# Patient Record
Sex: Male | Born: 1955 | Race: White | Hispanic: No | Marital: Married | Smoking: Never smoker
Health system: Southern US, Community
[De-identification: ages and names within clinical notes are randomized; demographics above are authoritative.]

## PROBLEM LIST (undated history)

## (undated) DIAGNOSIS — E785 Hyperlipidemia, unspecified: Secondary | ICD-10-CM

## (undated) DIAGNOSIS — I10 Essential (primary) hypertension: Secondary | ICD-10-CM

## (undated) DIAGNOSIS — E119 Type 2 diabetes mellitus without complications: Secondary | ICD-10-CM

## (undated) DIAGNOSIS — R7989 Other specified abnormal findings of blood chemistry: Secondary | ICD-10-CM

## (undated) DIAGNOSIS — R7303 Prediabetes: Secondary | ICD-10-CM

## (undated) HISTORY — PX: VASECTOMY: SHX75

## (undated) HISTORY — PX: COLONOSCOPY: SHX174

## (undated) HISTORY — DX: Hyperlipidemia, unspecified: E78.5

## (undated) HISTORY — DX: Prediabetes: R73.03

## (undated) HISTORY — PX: MENISCUS REPAIR: SHX5179

## (undated) HISTORY — DX: Type 2 diabetes mellitus without complications: E11.9

---

## 2000-06-24 HISTORY — PX: ROTATOR CUFF REPAIR: SHX139

## 2000-12-18 ENCOUNTER — Ambulatory Visit (HOSPITAL_COMMUNITY): Admission: RE | Admit: 2000-12-18 | Discharge: 2000-12-18 | Payer: Self-pay | Admitting: Orthopedic Surgery

## 2000-12-18 ENCOUNTER — Encounter: Payer: Self-pay | Admitting: Orthopedic Surgery

## 2001-02-11 ENCOUNTER — Ambulatory Visit (HOSPITAL_COMMUNITY): Admission: RE | Admit: 2001-02-11 | Discharge: 2001-02-11 | Payer: Self-pay | Admitting: Orthopedic Surgery

## 2007-03-03 ENCOUNTER — Ambulatory Visit: Payer: Self-pay | Admitting: Gastroenterology

## 2007-03-04 ENCOUNTER — Ambulatory Visit: Payer: Self-pay | Admitting: Gastroenterology

## 2007-09-15 DIAGNOSIS — K648 Other hemorrhoids: Secondary | ICD-10-CM | POA: Insufficient documentation

## 2007-09-15 DIAGNOSIS — I10 Essential (primary) hypertension: Secondary | ICD-10-CM | POA: Insufficient documentation

## 2007-09-15 DIAGNOSIS — E291 Testicular hypofunction: Secondary | ICD-10-CM | POA: Insufficient documentation

## 2007-09-15 DIAGNOSIS — E1159 Type 2 diabetes mellitus with other circulatory complications: Secondary | ICD-10-CM

## 2007-09-15 DIAGNOSIS — K5732 Diverticulitis of large intestine without perforation or abscess without bleeding: Secondary | ICD-10-CM | POA: Insufficient documentation

## 2007-09-15 DIAGNOSIS — K644 Residual hemorrhoidal skin tags: Secondary | ICD-10-CM | POA: Insufficient documentation

## 2010-11-06 NOTE — Assessment & Plan Note (Signed)
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 Alessandra Bevels for screening colonoscopy exam.   Carlos [name] had abnormal bowel movements all of his life and needs a  high fiber diet.  He will occasionally have some bright red blood per  rectum and associated tenesmus.  He has been told in the past he had  external hemorrhoids, but he has never had colonoscopy or barium enema  exams.  He denies abdominal pain, gas, bloating.  He currently is no  dietary restrictions per Dr. Alessandra Bevels and has been able to lose 15 pounds  of weight.  He denies any upper GI or hepatobiliary complaints.  He  denies any specific food intolerances.   PAST MEDICAL HISTORY:  Is otherwise remarkable for:  1. Mild essential hypertension.  2. Hypogonadism.   He has never had surgical procedures except for rotator cuff surgery  some 4 years ago.   MEDICATIONS:  1. Micardis 20 mg a day.  2. Testosterone 1 mL a day.  3. B12 2000 mcg a day.  4. D3 10,000 units a day.  5. Iodine 7.5 mg a day.  6. Omega 3, 3000 mg a day.  7. Arimidex 1 mg once a week.   IN THE PAST HE HAS HAD REACTIONS TO SULFA MEDICATIONS.   FAMILY HISTORY:  Noncontributory, without any known colon polyps or  cancer.   SOCIAL HISTORY:  1. He is married and lives with his wife.  2. He has a high school education.  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  4. He does not smoke or use ethanol.   REVIEW OF SYSTEMS:  Otherwise noncontributory except for history of mild  hyperlipidemia.   He is a healthy appearing white male in no distress, appearing his  stated.  He is 6 feet tall and weighs 289 pounds.  Blood pressure is  112/76 and pulse was 68 and regular.  I could not  appreciate stigmata of  chronic liver disease or thyromegaly.  CHEST:  Clear and he appeared to be a regular rhythm without murmurs,  gallops or rubs.  I could not appreciate hepatosplenomegaly, abdominal masses or  tenderness.  Bowel sounds were normal.  Peripheral extremities were  unremarkable.  MENTAL STATUS:  Clear.  INSPECTION OF RECTUM:  Did show some nonbleeding, noninflamed external  hemorrhoidal tissue.  Could not see any fissures or fistulae.  Rectal  exam was deferred.   ASSESSMENT:  1. Intermittent rectal bleeding probably from mixed hemorrhoids.  2. Need for screening colonoscopy because of his age.  3. Well controlled essential hypertension and hyperlipidemia.  4. Mild obesity, on dietary therapy per Dr. Jacqulyn Bath.  5. History of B12 deficiency.  6. Treated hypogonadism.  7. Status post rotator cuff surgery.   RECOMMENDATIONS:  I have scheduled Carlos [name] for outpatient colonoscopy  at  his convenience.  The risks and benefits of the procedure and  alternative methods of examining the colon have been explained to the  patient in detail and he has agreed to proceed as planned.  He is to  continue all of his multiple medications per the excellent care of Dr.  Alessandra Bevels.     Vania Rea. Jarold Motto, MD, Caleen Essex, FAGA  Electronically Signed    DRP/MedQ  DD: 03/03/2007  DT: 03/03/2007  Job #: [id number]   cc:   Allena Napoleon

## 2010-11-09 NOTE — Op Note (Signed)
[facility]  Patient:    Carlos [name], Carlos [name] Visit Number: [id number] MRN: [id number]          Service Type: DSU Location: DAY Attending Physician:  Marlowe Kays Page Proc. Date: 02/11/01 Adm. Date:  02/11/2001                             Operative Report  PREOPERATIVE DIAGNOSES: 1. Labral disruption (superior labrum anterior and posterior lesion). 2. 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  POSTOPERATIVE DIAGNOSES: 1. Labral disruption (superior labrum anterior and posterior lesion). 2. 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  OPERATIONs 1. Right shoulder arthroscopy with shaving of glenoid labrum. 2. Arthroscopic subacromial decompression.  SURGEON:  Illene Labrador. [place], M.D.  ASSISTANT:  Harrie Foreman, P.A.-C.  ANESTHESIA:  General.  PATHOLOGY AND JUSTIFICATION FOR PROCEDURE:  Injured his right shoulder wrestling several years ago, has chronic pain.  He has an arthritic AC joint but no tenderness there.  He has tenderness over the rotator cuff.  MRI has shown some impingement of the underneath surface of the [facility] joint on the rotator cuff as well as rotator cuff tendinopathy from type 2 acromion. Because of the disability of his shoulder, he is here today for treatment. See operative description below for findings.  DESCRIPTION OF PROCEDURE:  Satisfactory general anesthesia, beach chair position on the Schlein frame.  Right shoulder girdle was prepped with DuraPrep and draped in a sterile field.  The anatomy of the shoulder was marked out.  Posterior soft spot, lateral portal, and subacromion areas were infiltrated with 0.5% Marcaine with adrenalin.  Through a posterior soft spot portal, I introduced a blunt trocar into the glenohumeral joint without difficulty.  On evaluation of the joint, the humeral head had minimal wear. The biceps tendon looked normal,  and the underneath surface of the rotator cuff looked normal.  He had significant fraying of most of the labrum, but this did not appear to be detached.  Because of the labral disruption, I advanced the scope anteriorly between the biceps and subscapularis, used the switching stick, and made an anterior incision and over this, placed a metal cannula which I introduced in the joint followed by the 4.2 shaver. I then shaved down the glenoid labrum until smooth, confirming that the labrum was not detached.  Final pictures were taken.  I then advanced the scope and subacromial area and through the lateral portal introduced the 4.2 shaver and then performed a subdeltoid bursectomy.  He had a good bit of soft tissue on the underneath surface of the acromion which was contributing significantly to impingement.  He also had some disruption of the bursal surface of the rotator cuff as depicted on the MRI.  This was pictured and shaved down with the 4.2 shaver.  I then also used the Arthrocare vaporizer to remove most of the soft tissue on the underneath surface of the acromion, going back medial to the [facility] joint and around the anterior leading edge of the acromion.  I then introduced the 4.0 bur and began burring down the underneath surface of the acromion and distal clavicle and AC joint and then alternated back and forth between the bur, and Arthrocare vaporizer, and the 4.2 shaver until I felt like we had thorough decompression.  Thee was no unusual bleeding at the conclusion of the case.  Representative pictures were taken with  the arm down to the side and with the arm abducted showing excellent clearance in the subacromial area. All fluid was then evacuated from the subacromial joint, and the three portals and subacromial area were all infiltrated once again with 0.5% Marcaine with adrenalin.  The portals were closed with 4-0 nylon, Betadine, and Adaptic dry sterile dressing, and a large shoulder  immobilizer were applied.  At the time of this dictation, he was on his way to the recovery room in satisfactory condition with no known complications. Attending Physician:  Joaquin Courts DD:  02/11/01 TD:  02/11/01 Job: [id number]

## 2015-03-28 ENCOUNTER — Ambulatory Visit (INDEPENDENT_AMBULATORY_CARE_PROVIDER_SITE_OTHER): Payer: BLUE CROSS/BLUE SHIELD | Admitting: Family Medicine

## 2015-03-28 ENCOUNTER — Encounter: Payer: Self-pay | Admitting: Family Medicine

## 2015-03-28 VITALS — BP 140/86 | HR 77 | Wt 292.0 lb

## 2015-03-28 DIAGNOSIS — M940 Chondrocostal junction syndrome [Tietze]: Secondary | ICD-10-CM

## 2015-03-28 DIAGNOSIS — M999 Biomechanical lesion, unspecified: Secondary | ICD-10-CM

## 2015-03-28 DIAGNOSIS — M9902 Segmental and somatic dysfunction of thoracic region: Secondary | ICD-10-CM | POA: Diagnosis not present

## 2015-03-28 DIAGNOSIS — M9908 Segmental and somatic dysfunction of rib cage: Secondary | ICD-10-CM

## 2015-03-28 DIAGNOSIS — M9901 Segmental and somatic dysfunction of cervical region: Secondary | ICD-10-CM | POA: Diagnosis not present

## 2015-03-28 MED ORDER — DICLOFENAC SODIUM 2 % TD SOLN: 2.0000 "application " | Freq: Two times a day (BID) | TRANSDERMAL | Status: DC

## 2015-03-28 NOTE — Patient Instructions (Signed)
Good to see  You it has been a long time! Work on posture and try to do these exercises 3 times a week When at a computer keep monitor at eye level and keep back of shoulder blades in chair Stand on wall with heels, butt shoulder and head touching for a goal of 5 minutes daily Vitamin D 5000 IU daily Turmeric 500mg  twice daily pennsaid pinkie amount topically 2 times daily as needed.  See me again in 4-6 weeks if you need me.

## 2015-03-28 NOTE — Assessment & Plan Note (Signed)
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 We discussed icing regimen, home exercises, and patient work with athletic trainer to learn scapular stabilization techniques. We discussed icing regimen and patient given trial of topical anti-inflammatory's as well as prescription.we also discussed over-the-counter natural supplementations. Encourage weight loss. Patient will return again in 3-4 weeks for further evaluation and treatment.

## 2015-03-28 NOTE — Progress Notes (Signed)
Pre visit review using our clinic review tool, if applicable. No additional management support is needed unless otherwise documented below in the visit note. 

## 2015-03-28 NOTE — Progress Notes (Signed)
[name] [address], [place] [address] Phone: [phone] Subjective:    I'm seeing this patient by the request  of:  Vaughn M.D.  CC: back pain  [id number]:[id number] Carlos [name] is a 59 y.o. male coming in with complaint of back pain. Patient states that he is been doing relatively well and then was doing a lot of repetitive casting while he was fishing and felt a muscle spasm on the right side. Patient describes it as more of a chronic compression force near the shoulder blade. Patient has had some problems since then. Does respond to some over-the-counter anti-inflammatory's. No severe radiation down the arm or up towards the neck. Denies any fever, chills, any abnormal weight loss. Can make a difficulty at comfortable at night. Has responded well to manipulation in the past and is wondering if that would be a possible treatment option.  No past medical history on file. No past surgical history on file. Social History  Substance Use Topics  . Smoking status: Never Smoker   . Smokeless tobacco: None  . Alcohol Use: None   Allergies  Allergen Reactions  . Sulfa Antibiotics        No family history on file.   Past medical history, social, surgical and family history all reviewed in electronic medical record.   Review of Systems: No headache, visual changes, nausea, vomiting, diarrhea, constipation, dizziness, abdominal pain, skin rash, fevers, chills, night sweats, weight loss, swollen lymph nodes, body aches, joint swelling, muscle aches, chest pain, shortness of breath, mood changes.   Objective Blood pressure 140/86, pulse 77, weight 292 lb (132.45 kg), SpO2 95 %.  General: No apparent distress alert and oriented x3 mood and affect normal, dressed appropriately. overweight HEENT: Pupils equal, extraocular movements intact  Respiratory: Patient's speak in full sentences and does not appear short of breath  Cardiovascular: No lower  extremity edema, non tender, no erythema  Skin: Warm dry intact with no signs of infection or rash on extremities or on axial skeleton.  Abdomen: Soft nontender  Neuro: Cranial nerves II through XII are intact, neurovascularly intact in all extremities with 2+ DTRs and 2+ pulses.  Lymph: No lymphadenopathy of posterior or anterior cervical chain or axillae bilaterally.  Gait normal with good balance and coordination.  MSK:  Non tender with full range of motion and good stability and symmetric strength and tone of shoulders, elbows, wrist, hip, knee and ankles bilaterally.   Back Exam:  Inspection: Unremarkable  Motion: Flexion 45 deg, Extension 45 deg, Side Bending to 45 deg bilaterally,  Rotation to 45 deg bilaterally  SLR laying: Negative  XSLR laying: Negative  Palpable tenderness: None. FABER: negative. Sensory change: Gross sensation intact to all lumbar and sacral dermatomes.  Reflexes: 2+ at both patellar tendons, 2+ at achilles tendons, Babinski's downgoing.  Strength at foot  Plantar-flexion: 5/5 Dorsi-flexion: 5/5 Eversion: 5/5 Inversion: 5/5  Leg strength  Quad: 5/5 Hamstring: 5/5 Hip flexor: 5/5 Hip abductors: 5/5  Gait unremarkable. Mild scapular dyskinesia noted on the right side  Osteopathic findings C4 flexed rotated and side bent right C7 flexed rotated and side bent left  T1 extended rotated and side bent right with elevated first rib T5 extended rotated and side bent right on the right side with inhaled fifth rib  L2 flexed rotated and side bent left   Impression and Recommendations:     This case required medical decision making of moderate complexity.

## 2015-03-28 NOTE — Assessment & Plan Note (Signed)
Decision today to treat with OMT was based on Physical Exam  After verbal consent patient was treated with HVLA, ME, FPR techniques in cervical, thoracic and rib areas  Patient tolerated the procedure well with improvement in symptoms  Patient given exercises, stretches and lifestyle modifications  See medications in patient instructions if given  Patient will follow up in 3-4 weeks      

## 2015-10-16 DIAGNOSIS — B029 Zoster without complications: Secondary | ICD-10-CM | POA: Diagnosis not present

## 2015-10-16 DIAGNOSIS — E7219 Other disorders of sulfur-bearing amino-acid metabolism: Secondary | ICD-10-CM | POA: Diagnosis not present

## 2015-10-16 DIAGNOSIS — E291 Testicular hypofunction: Secondary | ICD-10-CM | POA: Diagnosis not present

## 2015-10-16 DIAGNOSIS — E039 Hypothyroidism, unspecified: Secondary | ICD-10-CM | POA: Diagnosis not present

## 2015-10-16 LAB — CBC AND DIFFERENTIAL
HCT: 50 (ref 41–53)
Hemoglobin: 16.9 (ref 13.5–17.5)
PLATELETS: 197 (ref 150–399)
WBC: 4.6

## 2015-10-16 LAB — HEPATIC FUNCTION PANEL
ALT: 14 (ref 10–40)
AST: 29 (ref 14–40)
Alkaline Phosphatase: 90 (ref 25–125)
Bilirubin, Total: 1.3

## 2015-10-16 LAB — PSA: PSA: 3.52

## 2015-10-16 LAB — BASIC METABOLIC PANEL
BUN: 13 (ref 4–21)
CREATININE: 1.1 (ref <=1.3)
Glucose: 91
POTASSIUM: 4.7 (ref 3.4–5.3)
SODIUM: 140 (ref 137–147)

## 2015-10-16 LAB — HEMOGLOBIN A1C: Hemoglobin A1C: 5.5

## 2015-10-30 DIAGNOSIS — E291 Testicular hypofunction: Secondary | ICD-10-CM | POA: Diagnosis not present

## 2015-11-06 DIAGNOSIS — S82142A Displaced bicondylar fracture of left tibia, initial encounter for closed fracture: Secondary | ICD-10-CM | POA: Diagnosis not present

## 2015-11-06 DIAGNOSIS — M25562 Pain in left knee: Secondary | ICD-10-CM | POA: Diagnosis not present

## 2015-11-08 ENCOUNTER — Encounter (HOSPITAL_COMMUNITY): Payer: Self-pay | Admitting: *Deleted

## 2015-11-08 DIAGNOSIS — S82142D Displaced bicondylar fracture of left tibia, subsequent encounter for closed fracture with routine healing: Secondary | ICD-10-CM | POA: Diagnosis not present

## 2015-11-08 MED ORDER — DEXTROSE 5 % IV SOLN
3.0000 g | INTRAVENOUS | Status: AC
  Administered 2015-11-09: 3 g via INTRAVENOUS
  Filled 2015-11-08: qty 3000

## 2015-11-08 MED ORDER — CHLORHEXIDINE GLUCONATE 4 % EX LIQD: 60.0000 mL | Freq: Once | CUTANEOUS | Status: DC

## 2015-11-08 NOTE — Progress Notes (Signed)
Pt denies cardiac history, chest pain or sob. Pt is on Metformin but states he takes it "preventatively". States Dr. Adriana Simas, his PCP told him he no longer had diabetes.

## 2015-11-09 ENCOUNTER — Encounter (HOSPITAL_COMMUNITY)
Admission: RE | Disposition: A | Discharge status: Home / Self Care | Payer: Self-pay | Source: Ambulatory Visit | Attending: Orthopedic Surgery

## 2015-11-09 ENCOUNTER — Ambulatory Visit (HOSPITAL_COMMUNITY): Payer: BLUE CROSS/BLUE SHIELD | Admitting: Anesthesiology

## 2015-11-09 ENCOUNTER — Encounter (HOSPITAL_COMMUNITY): Payer: Self-pay | Admitting: *Deleted

## 2015-11-09 ENCOUNTER — Ambulatory Visit (HOSPITAL_COMMUNITY): Payer: BLUE CROSS/BLUE SHIELD

## 2015-11-09 ENCOUNTER — Ambulatory Visit (HOSPITAL_COMMUNITY)
Admission: RE | Admit: 2015-11-09 | Discharge: 2015-11-09 | Disposition: A | Discharge status: Home / Self Care | Payer: BLUE CROSS/BLUE SHIELD | Source: Ambulatory Visit | Attending: Orthopedic Surgery | Admitting: Orthopedic Surgery

## 2015-11-09 DIAGNOSIS — Z791 Long term (current) use of non-steroidal anti-inflammatories (NSAID): Secondary | ICD-10-CM | POA: Diagnosis not present

## 2015-11-09 DIAGNOSIS — E119 Type 2 diabetes mellitus without complications: Secondary | ICD-10-CM | POA: Diagnosis not present

## 2015-11-09 DIAGNOSIS — I10 Essential (primary) hypertension: Secondary | ICD-10-CM | POA: Insufficient documentation

## 2015-11-09 DIAGNOSIS — S82142A Displaced bicondylar fracture of left tibia, initial encounter for closed fracture: Secondary | ICD-10-CM | POA: Diagnosis not present

## 2015-11-09 DIAGNOSIS — X58XXXA Exposure to other specified factors, initial encounter: Secondary | ICD-10-CM | POA: Insufficient documentation

## 2015-11-09 DIAGNOSIS — Z7984 Long term (current) use of oral hypoglycemic drugs: Secondary | ICD-10-CM | POA: Diagnosis not present

## 2015-11-09 DIAGNOSIS — Z6836 Body mass index (BMI) 36.0-36.9, adult: Secondary | ICD-10-CM | POA: Diagnosis not present

## 2015-11-09 DIAGNOSIS — Z79899 Other long term (current) drug therapy: Secondary | ICD-10-CM | POA: Insufficient documentation

## 2015-11-09 DIAGNOSIS — T148XXA Other injury of unspecified body region, initial encounter: Secondary | ICD-10-CM

## 2015-11-09 DIAGNOSIS — G8918 Other acute postprocedural pain: Secondary | ICD-10-CM | POA: Diagnosis not present

## 2015-11-09 DIAGNOSIS — S82122D Displaced fracture of lateral condyle of left tibia, subsequent encounter for closed fracture with routine healing: Secondary | ICD-10-CM | POA: Diagnosis not present

## 2015-11-09 HISTORY — DX: Other specified abnormal findings of blood chemistry: R79.89

## 2015-11-09 HISTORY — PX: ORIF TIBIA PLATEAU: SHX2132

## 2015-11-09 HISTORY — DX: Essential (primary) hypertension: I10

## 2015-11-09 LAB — BASIC METABOLIC PANEL
ANION GAP: 11 (ref 5–15)
BUN: 16 mg/dL (ref 6–20)
CALCIUM: 9.5 mg/dL (ref 8.9–10.3)
CHLORIDE: 100 mmol/L — AB (ref 101–111)
CO2: 27 mmol/L (ref 22–32)
Creatinine, Ser: 1.15 mg/dL (ref 0.61–1.24)
GFR calc Af Amer: >60 mL/min (ref >60)
GFR calc non Af Amer: >60 mL/min (ref >60)
Glucose, Bld: 110 mg/dL — ABNORMAL HIGH (ref 65–99)
POTASSIUM: 4.7 mmol/L (ref 3.5–5.1)
SODIUM: 138 mmol/L (ref 135–145)

## 2015-11-09 LAB — GLUCOSE, CAPILLARY: Glucose-Capillary: 106 mg/dL — ABNORMAL HIGH (ref 65–99)

## 2015-11-09 LAB — CBC
HCT: 47.9 % (ref 39.0–52.0)
HEMOGLOBIN: 16.1 g/dL (ref 13.0–17.0)
MCH: 29.9 pg (ref 26.0–34.0)
MCHC: 33.6 g/dL (ref 30.0–36.0)
MCV: 88.9 fL (ref 78.0–100.0)
PLATELETS: 199 10*3/uL (ref 150–400)
RBC: 5.39 MIL/uL (ref 4.22–5.81)
RDW: 13.3 % (ref 11.5–15.5)
WBC: 7.2 10*3/uL (ref 4.0–10.5)

## 2015-11-09 SURGERY — OPEN REDUCTION INTERNAL FIXATION (ORIF) TIBIAL PLATEAU
Anesthesia: Regional | Site: Knee | Laterality: Left

## 2015-11-09 MED ORDER — FENTANYL CITRATE (PF) 100 MCG/2ML IJ SOLN
INTRAMUSCULAR | Status: AC
  Administered 2015-11-09: 100 ug
  Filled 2015-11-09: qty 2

## 2015-11-09 MED ORDER — PROPOFOL 10 MG/ML IV BOLUS
INTRAVENOUS | Status: DC | PRN
  Administered 2015-11-09: 200 mg via INTRAVENOUS

## 2015-11-09 MED ORDER — MIDAZOLAM HCL 5 MG/ML IJ SOLN
2.0000 mg | Freq: Once | INTRAMUSCULAR | Status: AC
  Administered 2015-11-09: 2 mg via INTRAVENOUS

## 2015-11-09 MED ORDER — ROCURONIUM BROMIDE 50 MG/5ML IV SOLN
INTRAVENOUS | Status: AC
  Filled 2015-11-09: qty 2

## 2015-11-09 MED ORDER — ACETAMINOPHEN 325 MG PO TABS
325.0000 mg | ORAL_TABLET | ORAL | Status: DC | PRN
  Administered 2015-11-09: 650 mg via ORAL

## 2015-11-09 MED ORDER — ACETAMINOPHEN 160 MG/5ML PO SOLN
325.0000 mg | ORAL | Status: DC | PRN
  Filled 2015-11-09: qty 20.3

## 2015-11-09 MED ORDER — MIDAZOLAM HCL 2 MG/2ML IJ SOLN
INTRAMUSCULAR | Status: AC
  Filled 2015-11-09: qty 2

## 2015-11-09 MED ORDER — LIDOCAINE 2% (20 MG/ML) 5 ML SYRINGE
INTRAMUSCULAR | Status: AC
  Filled 2015-11-09: qty 5

## 2015-11-09 MED ORDER — ONDANSETRON HCL 4 MG PO TABS: 4.0000 mg | ORAL_TABLET | Freq: Three times a day (TID) | ORAL | Status: DC | PRN

## 2015-11-09 MED ORDER — DIAZEPAM 5 MG PO TABS: 2.5000 mg | ORAL_TABLET | Freq: Four times a day (QID) | ORAL | Status: DC | PRN

## 2015-11-09 MED ORDER — LACTATED RINGERS IV SOLN
INTRAVENOUS | Status: DC
  Administered 2015-11-09 (×3): via INTRAVENOUS

## 2015-11-09 MED ORDER — 0.9 % SODIUM CHLORIDE (POUR BTL) OPTIME
TOPICAL | Status: DC | PRN
  Administered 2015-11-09: 1000 mL

## 2015-11-09 MED ORDER — FENTANYL CITRATE (PF) 100 MCG/2ML IJ SOLN: 100.0000 ug | Freq: Once | INTRAMUSCULAR | Status: DC

## 2015-11-09 MED ORDER — PROPOFOL 10 MG/ML IV BOLUS
INTRAVENOUS | Status: AC
  Filled 2015-11-09: qty 40

## 2015-11-09 MED ORDER — HYDROMORPHONE HCL 1 MG/ML IJ SOLN: 0.2500 mg | INTRAMUSCULAR | Status: DC | PRN

## 2015-11-09 MED ORDER — BUPIVACAINE-EPINEPHRINE (PF) 0.5% -1:200000 IJ SOLN
INTRAMUSCULAR | Status: DC | PRN
  Administered 2015-11-09: 30 mL via PERINEURAL
  Administered 2015-11-09: 25 mL via PERINEURAL

## 2015-11-09 MED ORDER — ONDANSETRON HCL 4 MG/2ML IJ SOLN
INTRAMUSCULAR | Status: AC
  Filled 2015-11-09: qty 2

## 2015-11-09 MED ORDER — OXYCODONE HCL 5 MG PO TABS: 5.0000 mg | ORAL_TABLET | Freq: Once | ORAL | Status: DC | PRN

## 2015-11-09 MED ORDER — OXYCODONE-ACETAMINOPHEN 5-325 MG PO TABS: 1.0000 | ORAL_TABLET | ORAL | Status: DC | PRN

## 2015-11-09 MED ORDER — ONDANSETRON HCL 4 MG/2ML IJ SOLN
INTRAMUSCULAR | Status: DC | PRN
  Administered 2015-11-09: 4 mg via INTRAVENOUS

## 2015-11-09 MED ORDER — OXYCODONE HCL 5 MG/5ML PO SOLN: 5.0000 mg | Freq: Once | ORAL | Status: DC | PRN

## 2015-11-09 MED ORDER — FENTANYL CITRATE (PF) 250 MCG/5ML IJ SOLN
INTRAMUSCULAR | Status: AC
  Filled 2015-11-09: qty 5

## 2015-11-09 MED ORDER — ACETAMINOPHEN 325 MG PO TABS
ORAL_TABLET | ORAL | Status: AC
  Filled 2015-11-09: qty 2

## 2015-11-09 MED ORDER — LIDOCAINE HCL (CARDIAC) 20 MG/ML IV SOLN
INTRAVENOUS | Status: DC | PRN
  Administered 2015-11-09: 60 mg via INTRAVENOUS

## 2015-11-09 SURGICAL SUPPLY — 81 items
BANDAGE ELASTIC 4 VELCRO ST LF (GAUZE/BANDAGES/DRESSINGS) IMPLANT
BANDAGE ELASTIC 6 VELCRO ST LF (GAUZE/BANDAGES/DRESSINGS) ×2 IMPLANT
BANDAGE ESMARK 6X9 LF (GAUZE/BANDAGES/DRESSINGS) ×1 IMPLANT
BLADE SURG 10 STRL SS (BLADE) ×2 IMPLANT
BLADE SURG 15 STRL LF DISP TIS (BLADE) ×1 IMPLANT
BLADE SURG 15 STRL SS (BLADE) ×1
BLADE SURG ROTATE 9660 (MISCELLANEOUS) IMPLANT
BNDG COHESIVE 6X5 TAN STRL LF (GAUZE/BANDAGES/DRESSINGS) ×2 IMPLANT
BNDG ESMARK 6X9 LF (GAUZE/BANDAGES/DRESSINGS) ×2
BNDG GAUZE ELAST 4 BULKY (GAUZE/BANDAGES/DRESSINGS) ×2 IMPLANT
CLEANER TIP ELECTROSURG 2X2 (MISCELLANEOUS) ×2 IMPLANT
COVER MAYO STAND STRL (DRAPES) ×2 IMPLANT
CUFF TOURNIQUET SINGLE 34IN LL (TOURNIQUET CUFF) ×2 IMPLANT
CUFF TOURNIQUET SINGLE 44IN (TOURNIQUET CUFF) IMPLANT
DERMABOND ADHESIVE PROPEN (GAUZE/BANDAGES/DRESSINGS) ×1
DERMABOND ADVANCED .7 DNX6 (GAUZE/BANDAGES/DRESSINGS) ×1 IMPLANT
DRAPE C-ARM 42X72 X-RAY (DRAPES) ×2 IMPLANT
DRAPE C-ARMOR (DRAPES) ×2 IMPLANT
DRAPE INCISE IOBAN 66X45 STRL (DRAPES) ×4 IMPLANT
DRAPE U-SHAPE 47X51 STRL (DRAPES) ×2 IMPLANT
DRSG ADAPTIC 3X8 NADH LF (GAUZE/BANDAGES/DRESSINGS) ×2 IMPLANT
DRSG AQUACEL AG ADV 3.5X 4 (GAUZE/BANDAGES/DRESSINGS) ×2 IMPLANT
DRSG PAD ABDOMINAL 8X10 ST (GAUZE/BANDAGES/DRESSINGS) ×4 IMPLANT
DURAPREP 26ML APPLICATOR (WOUND CARE) ×2 IMPLANT
ELECT REM PT RETURN 9FT ADLT (ELECTROSURGICAL) ×2
ELECTRODE REM PT RTRN 9FT ADLT (ELECTROSURGICAL) ×1 IMPLANT
GAUZE SPONGE 4X4 12PLY STRL (GAUZE/BANDAGES/DRESSINGS) ×4 IMPLANT
GLOVE BIO SURGEON STRL SZ7.5 (GLOVE) ×2 IMPLANT
GLOVE BIO SURGEON STRL SZ8 (GLOVE) ×4 IMPLANT
GLOVE BIOGEL M STER SZ 6 (GLOVE) ×2 IMPLANT
GLOVE BIOGEL PI IND STRL 6 (GLOVE) ×1 IMPLANT
GLOVE BIOGEL PI IND STRL 6.5 (GLOVE) ×1 IMPLANT
GLOVE BIOGEL PI IND STRL 7.0 (GLOVE) ×1 IMPLANT
GLOVE BIOGEL PI INDICATOR 6 (GLOVE) ×1
GLOVE BIOGEL PI INDICATOR 6.5 (GLOVE) ×1
GLOVE BIOGEL PI INDICATOR 7.0 (GLOVE) ×1
GLOVE EUDERMIC 7 POWDERFREE (GLOVE) ×2 IMPLANT
GLOVE SS BIOGEL STRL SZ 7.5 (GLOVE) ×1 IMPLANT
GLOVE SUPERSENSE BIOGEL SZ 7.5 (GLOVE) ×1
GLOVE SURG SS PI 6.0 STRL IVOR (GLOVE) ×2 IMPLANT
GLOVE SURG SS PI 6.5 STRL IVOR (GLOVE) ×2 IMPLANT
GOWN STRL REUS W/ TWL LRG LVL3 (GOWN DISPOSABLE) ×1 IMPLANT
GOWN STRL REUS W/ TWL XL LVL3 (GOWN DISPOSABLE) ×2 IMPLANT
GOWN STRL REUS W/TWL LRG LVL3 (GOWN DISPOSABLE) ×1
GOWN STRL REUS W/TWL XL LVL3 (GOWN DISPOSABLE) ×2
GUIDEWIRE THREADED 2.8 (WIRE) ×6 IMPLANT
KIT BASIN OR (CUSTOM PROCEDURE TRAY) ×2 IMPLANT
KIT ROOM TURNOVER OR (KITS) ×2 IMPLANT
MANIFOLD NEPTUNE II (INSTRUMENTS) ×2 IMPLANT
NEEDLE 22X1 1/2 (OR ONLY) (NEEDLE) ×2 IMPLANT
NS IRRIG 1000ML POUR BTL (IV SOLUTION) ×2 IMPLANT
PACK ORTHO EXTREMITY (CUSTOM PROCEDURE TRAY) ×2 IMPLANT
PAD ARMBOARD 7.5X6 YLW CONV (MISCELLANEOUS) ×4 IMPLANT
PAD CAST 4YDX4 CTTN HI CHSV (CAST SUPPLIES) IMPLANT
PADDING CAST COTTON 4X4 STRL (CAST SUPPLIES)
PADDING CAST COTTON 6X4 STRL (CAST SUPPLIES) IMPLANT
SCREW CANN 32 THRD/60 7.3 (Screw) ×2 IMPLANT
SCREW CANN 32 THRD/75 7.3 (Screw) ×2 IMPLANT
SCREW CANN 32 THRD/80 7.3 (Screw) ×2 IMPLANT
SPONGE LAP 18X18 X RAY DECT (DISPOSABLE) ×2 IMPLANT
STAPLER VISISTAT 35W (STAPLE) IMPLANT
STOCKINETTE IMPERVIOUS LG (DRAPES) ×2 IMPLANT
STRIP CLOSURE SKIN 1/2X4 (GAUZE/BANDAGES/DRESSINGS) ×4 IMPLANT
SUCTION FRAZIER HANDLE 10FR (MISCELLANEOUS) ×1
SUCTION TUBE FRAZIER 10FR DISP (MISCELLANEOUS) ×1 IMPLANT
SUT ETHIBOND NAB CT1 #1 30IN (SUTURE) ×2 IMPLANT
SUT MNCRL AB 3-0 PS2 18 (SUTURE) ×4 IMPLANT
SUT MON AB 2-0 CT1 36 (SUTURE) ×2 IMPLANT
SUT VIC AB 0 CT1 27 (SUTURE) ×1
SUT VIC AB 0 CT1 27XBRD ANBCTR (SUTURE) ×1 IMPLANT
SUT VIC AB 1 CT1 27 (SUTURE) ×2
SUT VIC AB 1 CT1 27XBRD ANBCTR (SUTURE) ×2 IMPLANT
SUT VIC AB 2-0 CT1 27 (SUTURE) ×1
SUT VIC AB 2-0 CT1 TAPERPNT 27 (SUTURE) ×1 IMPLANT
SYR 20ML ECCENTRIC (SYRINGE) IMPLANT
TOWEL OR 17X24 6PK STRL BLUE (TOWEL DISPOSABLE) ×2 IMPLANT
TOWEL OR 17X26 10 PK STRL BLUE (TOWEL DISPOSABLE) ×2 IMPLANT
TUBE CONNECTING 12X1/4 (SUCTIONS) ×2 IMPLANT
WASHER FOR 5.0 SCREWS (Washer) ×6 IMPLANT
WATER STERILE IRR 1000ML POUR (IV SOLUTION) IMPLANT
YANKAUER SUCT BULB TIP NO VENT (SUCTIONS) ×2 IMPLANT

## 2015-11-09 NOTE — Transfer of Care (Signed)
Immediate Anesthesia Transfer of Care Note  Patient: Carlos [name]  Procedure(s) Performed: Procedure(s): OPEN REDUCTION INTERNAL (ORIF) FIXATION LEFT LATERAL TIBIAL PLATEAU (Left)  Patient Location: PACU  Anesthesia Type:General  Level of Consciousness: awake, alert , oriented and patient cooperative  Airway & Oxygen Therapy: Patient Spontanous Breathing and Patient connected to nasal cannula oxygen  Post-op Assessment: Report given to RN and Post -op Vital signs reviewed and stable  Post vital signs: Reviewed and stable  Last Vitals:  Filed Vitals:   11/09/15 0623  BP: 132/84  Pulse: 73  Temp: 36.9 C  Resp: 20    Last Pain: There were no vitals filed for this visit.    Patients Stated Pain Goal: 4 ([id number])  Complications: No apparent anesthesia complications

## 2015-11-09 NOTE — Anesthesia Procedure Notes (Addendum)
Anesthesia Regional Block:  Popliteal block  Pre-Anesthetic Checklist: ,, timeout performed, Correct Patient, Correct Site, Correct Laterality, Correct Procedure, Correct Position, site marked, Risks and benefits discussed,  Surgical consent,  Pre-op evaluation,  At surgeon's request and post-op pain management  Laterality: Lower and Left  Prep: chloraprep       Needles:  Injection technique: Single-shot  Needle Type: Echogenic Stimulator Needle          Additional Needles:  Procedures: ultrasound guided (picture in chart) and nerve stimulator Popliteal block  Nerve Stimulator or Paresthesia:  Response: plantar, 0.5 mA,   Additional Responses:   Narrative:  Injection made incrementally with aspirations every 5 mL.  Performed by: Personally  Anesthesiologist: MOSER, CHRISTOPHER  Additional Notes: H+P and labs reviewed, risks and benefits discussed with patient, procedure tolerated well without complications   Anesthesia Regional Block:  Femoral nerve block  Pre-Anesthetic Checklist: ,, timeout performed, Correct Patient, Correct Site, Correct Laterality, Correct Procedure, Correct Position, site marked, Risks and benefits discussed,  Surgical consent,  Pre-op evaluation,  At surgeon's request and post-op pain management  Laterality: Lower and Left  Prep: chloraprep       Needles:  Injection technique: Single-shot  Needle Type: Echogenic Stimulator Needle          Additional Needles:  Procedures: ultrasound guided (picture in chart) and nerve stimulator Femoral nerve block  Nerve Stimulator or Paresthesia:  Response: quad, 0.5 mA,   Additional Responses:   Narrative:  Injection made incrementally with aspirations every 5 mL.  Performed by: Personally  Anesthesiologist: Oleta Mouse  Additional Notes: H+P and labs reviewed, risks and benefits discussed with patient, procedure tolerated well without complications   Procedure Name: LMA  Insertion Date/Time: 11/09/2015 9:01 AM Performed by: Williemae Area B Pre-anesthesia Checklist: Emergency Drugs available, Patient identified, Suction available and Patient being monitored Patient Re-evaluated:Patient Re-evaluated prior to inductionOxygen Delivery Method: Circle system utilized Preoxygenation: Pre-oxygenation with 100% oxygen Intubation Type: IV induction Ventilation: Mask ventilation with difficulty LMA: LMA inserted LMA Size: 5.0 Number of attempts: 1 Placement Confirmation: positive ETCO2 and breath sounds checked- equal and bilateral Tube secured with: Tape (taped across cheeks; gauze roll b/t teeth) Dental Injury: Teeth and Oropharynx as per pre-operative assessment  Comments: Ineffective mask ventilation due to beard

## 2015-11-09 NOTE — Discharge Instructions (Signed)
Use crutches for ambulation and touch down weight only on leg. It is ok to bend your knee slightly to dress and for hygiene but knee immobilizer when up and about Use ice for 20 minutes at a time several times a day Perform calf pumps a few times an hour and take one aspirin a day You may shower, your dressings are waterproof. Leave the large dressing on until you see [place] in office but you can change the bandaid on the inside as needed

## 2015-11-09 NOTE — Op Note (Signed)
11/09/2015  10:18 AM  PATIENT:   Carlos [name]  60 y.o. male  PRE-OPERATIVE DIAGNOSIS:  LEFT KNEE LATERAL TIBIA PLATEAU FRACTURE  POST-OPERATIVE DIAGNOSIS:  same  PROCEDURE:  ORIF left lateral tibial plateau fracture  SURGEON:  Jhayla Podgorski, Metta Clines M.D.  ASSISTANTS: Shuford pac   ANESTHESIA:   GET + FNB  EBL: min  SPECIMEN:  none  Drains: none  TT <60 min   PATIENT DISPOSITION:  PACU - hemodynamically stable.    PLAN OF CARE: Discharge to home after PACU  Dictation# [id number]   Contact # [phone]

## 2015-11-09 NOTE — H&P (Signed)
Carlos [name]    Chief Complaint: LEFT KNEE LATERAL TIBIA PLATEAU FRACTURE HPI: The patient is a 61 y.o. male with displaced left lateral tibial plateau fracture  Past Medical History  Diagnosis Date  . Hypertension   . Low testosterone     Past Surgical History  Procedure Laterality Date  . Rotator cuff repair Right 2002  . Vasectomy    . Colonoscopy      Family History  Problem Relation Age of Onset  . Cancer Mother   . Lung cancer Father   . Bladder Cancer Sister     Social History:  reports that he has never smoked. He has never used smokeless tobacco. He reports that he drinks alcohol. He reports that he does not use illicit drugs.   Medications Prior to Admission  Medication Sig Dispense Refill  . anastrozole (ARIMIDEX) 1 MG tablet Take 0.5 mg by mouth daily.    Marland Kitchen ibuprofen (ADVIL,MOTRIN) 200 MG tablet Take 400 mg by mouth every 6 (six) hours as needed (For pain.).    Marland Kitchen losartan (COZAAR) 100 MG tablet Take 100 mg by mouth daily.   0  . metFORMIN (GLUCOPHAGE-XR) 500 MG 24 hr tablet Take 500 mg by mouth daily with breakfast.   0  . niacin (NIASPAN) 500 MG CR tablet Take 500 mg by mouth 3 (three) times daily.   0  . testosterone cypionate (DEPOTESTOSTERONE CYPIONATE) 200 MG/ML injection Inject 100 mg into the muscle every 28 (twenty-eight) days. Take on Saturdays       Physical Exam: left knee with diffuse swelling and lateral tenderness as noted at recent office visit  Vitals  Temp:  [98.5 F (36.9 C)] 98.5 F (36.9 C) (05/18 0623) Pulse Rate:  [73] 73 (05/18 0623) Resp:  [20] 20 (05/18 0623) BP: (132)/(84) 132/84 mmHg (05/18 0623) SpO2:  [97 %] 97 % (05/18 0623) Weight:  [123.378 kg (272 lb)] 123.378 kg (272 lb) (05/18 [id number])  Assessment/Plan  Impression: LEFT KNEE LATERAL TIBIA PLATEAU FRACTURE  Plan of Action: Procedure(s): OPEN REDUCTION INTERNAL (ORIF) FIXATION LEFT LATERAL TIBIAL PLATEAU  Tashiya Souders M Maximilien Hayashi 11/09/2015, 8:46 AM Contact #  [phone]

## 2015-11-09 NOTE — Anesthesia Preprocedure Evaluation (Addendum)
Anesthesia Evaluation  Patient identified by MRN, date of birth, ID band Patient awake    Reviewed: Allergy & Precautions, NPO status , Patient's Chart, lab work & pertinent test results, reviewed documented beta blocker date and time   History of Anesthesia Complications Negative for: history of anesthetic complications  Airway Mallampati: II  TM Distance: >3 FB Neck ROM: Full    Dental  (+) Teeth Intact, Dental Advisory Given   Pulmonary neg pulmonary ROS,    breath sounds clear to auscultation       Cardiovascular hypertension, Pt. on medications (-) angina(-) Past MI and (-) CHF  Rhythm:Regular     Neuro/Psych negative neurological ROS  negative psych ROS   GI/Hepatic negative GI ROS, Neg liver ROS,   Endo/Other  diabetes, Well Controlled, Type 2, Oral Hypoglycemic AgentsMorbid obesity  Renal/GU negative Renal ROS     Musculoskeletal Left tibial plateau fracture   Abdominal   Peds  Hematology negative hematology ROS (+)   Anesthesia Other Findings Beard  Reproductive/Obstetrics                            Anesthesia Physical Anesthesia Plan  ASA: II  Anesthesia Plan: General and Regional   Post-op Pain Management:  Regional for Post-op pain   Induction: Intravenous  Airway Management Planned: LMA  Additional Equipment: None  Intra-op Plan:   Post-operative Plan: Extubation in OR  Informed Consent: I have reviewed the patients History and Physical, chart, labs and discussed the procedure including the risks, benefits and alternatives for the proposed anesthesia with the patient or authorized representative who has indicated his/her understanding and acceptance.   Dental advisory given  Plan Discussed with: CRNA and Surgeon  Anesthesia Plan Comments:         Anesthesia Quick Evaluation

## 2015-11-10 ENCOUNTER — Encounter (HOSPITAL_COMMUNITY): Payer: Self-pay | Admitting: Orthopedic Surgery

## 2015-11-10 NOTE — Op Note (Signed)
NAMESHAYA, MACHON                 ACCOUNT NO.:  [id number]  MEDICAL RECORD NO.:  [id number]  LOCATION:  [facility]                         FACILITY:  [facility]  PHYSICIAN:  Metta Clines. Ronny Korff, M.D.  DATE OF BIRTH:  1955-08-24  DATE OF PROCEDURE:  11/09/2015 DATE OF DISCHARGE:  11/09/2015                              OPERATIVE REPORT   PREOPERATIVE DIAGNOSIS:  Displaced left knee and lateral tibial plateau fracture.  POSTOPERATIVE DIAGNOSIS:  Displaced left knee and lateral tibial plateau fracture.  PROCEDURE:  Open reduction and internal fixation of left lateral tibial plateau fracture.  SURGEON:  Metta Clines. Jerrad Mendibles, M.D.  Terrence DupontOlivia Mackie A. Shuford, P.A.-C.  ANESTHESIA:  General endotracheal as well as a femoral nerve block.  TOURNIQUET TIME:  Less than 60 minutes.  ESTIMATED BLOOD LOSS:  Minimal.  DRAINS:  None.  HISTORY:  Mr. Gens is a 59 year old gentleman who sustained a left knee lateral tibial plateau fracture with moderate displacement.  MRI scan confirmed the degree of displacement.  He was brought to the operating room at this time for planned open reduction and internal fixation.  Preoperatively, I counseled Mr. Tornow on treatment options as well as risks versus benefits thereof.  Possible surgical complications were reviewed including bleeding, infection, neurovascular injury, DVT, PE, malunion, nonunion, loss of fixation and possible need for additional surgery.  He understands and accepts and agrees with our planned procedure.  PROCEDURE IN DETAIL:  After undergoing routine preop evaluation, the patient received prophylactic antibiotics and a femoral nerve block was established in the holding area by the Anesthesia Department.  Placed supine on the operating table, underwent smooth induction of a general endotracheal anesthesia.  Left lower extremity was sterilely prepped and draped in standard fashion with a tourniquet applied to the thigh.  Time- out was  called.  Leg was exsanguinated and the tourniquet was inflated to 350 mmHg.  I made a lateral hockey-stick incision at the longitudinal limb just lateral to the anterior tibial crest just lateral to the patellar tendon and then a posterior limb along the apex of the lateral tibial plateau posteriorly for approximately 3 cm.  Skin flap was elevated laterally, dissection carried deeply.  The fascia was then divided along the lateral margin of the tibial crest and then a posterior limb divided along the lateral tibial plateau for 3-4 cm. This allowed [place] to divide the anterior aspect of the IT band and gained access to the sub-meniscal aspect of the lateral tibial plateau and subperiosteal elevation was then used to identify the fracture site along the anterolateral aspect of the proximal tibia.  I introduced a Freer into the fracture site and this was then carefully opened and allowed [place] to irrigate and remove all interposed debris from the fracture site.  I then used the [employer], which applied through a stab wound medially and then through our wound laterally to allow [place] to compress the fracture site obtaining excellent reapproximation.  At this point, I placed three guidepins for the 6.5 cannulated screws off the Synthes tray at the appropriate positions beginning laterally.  These were then measured and placed a series of three 6.5 cannulated screws with  washers over the guidewires obtaining excellent purchase into the proximal tibial bone, allowed excellent reapposition and fixation with stable fixation of the tibial plateau fracture.  Final fluoroscopic images were obtained, which showed excellent overall position and alignment.  Good position of the hardware.  The wound was then copiously irrigated.  We then repaired the deep fascial layers as well as the anterior aspect of the IT band with #1 Vicryl sutures.  2-0 Vicryl was used to close the subcu layer, intracuticular 3-0  Monocryl for the skin followed by Steri-Strips.  Dry dressing was wrapped out the knee.  The leg was wrapped with Ace bandage, thigh support stocking, placed a knee immobilizer.  Tourniquet was let down.  Exact tourniquet time was available from the anesthesia record.  The patient was then awakened, extubated, and taken to the recovery room in stable condition.  Jenetta Loges, PA-C was used as an Environmental consultant throughout this case, essential for help with positioning of the patient, positioning of the extremity, management of the tissue retractors, wound closure and intraoperative decision making.     Metta Clines. Neddie Steedman, M.D.     KMS/MEDQ  D:  11/09/2015  T:  11/10/2015  Job:  [id number]

## 2015-11-11 NOTE — Anesthesia Postprocedure Evaluation (Signed)
Anesthesia Post Note  Patient: Carlos [name]  Procedure(s) Performed: Procedure(s) (LRB): OPEN REDUCTION INTERNAL (ORIF) FIXATION LEFT LATERAL TIBIAL PLATEAU (Left)  Patient location during evaluation: PACU Anesthesia Type: General Level of consciousness: awake Pain management: pain level controlled Vital Signs Assessment: post-procedure vital signs reviewed and stable Respiratory status: spontaneous breathing Cardiovascular status: stable Postop Assessment: no signs of nausea or vomiting Anesthetic complications: no    Last Vitals:  Filed Vitals:   11/09/15 1205 11/09/15 1217  BP: 139/78 136/83  Pulse: 67 71  Temp:  36.7 C  Resp: 17 16    Last Pain:  Filed Vitals:   11/11/15 1428  PainSc: 4                  Nitish Roes

## 2015-11-23 DIAGNOSIS — S82142D Displaced bicondylar fracture of left tibia, subsequent encounter for closed fracture with routine healing: Secondary | ICD-10-CM | POA: Diagnosis not present

## 2016-01-05 DIAGNOSIS — Z4889 Encounter for other specified surgical aftercare: Secondary | ICD-10-CM | POA: Diagnosis not present

## 2016-01-31 DIAGNOSIS — S82122D Displaced fracture of lateral condyle of left tibia, subsequent encounter for closed fracture with routine healing: Secondary | ICD-10-CM | POA: Diagnosis not present

## 2016-03-18 DIAGNOSIS — M25561 Pain in right knee: Secondary | ICD-10-CM | POA: Diagnosis not present

## 2016-03-27 DIAGNOSIS — M25561 Pain in right knee: Secondary | ICD-10-CM | POA: Diagnosis not present

## 2016-04-03 DIAGNOSIS — S83281D Other tear of lateral meniscus, current injury, right knee, subsequent encounter: Secondary | ICD-10-CM | POA: Diagnosis not present

## 2016-04-03 DIAGNOSIS — M25561 Pain in right knee: Secondary | ICD-10-CM | POA: Diagnosis not present

## 2016-04-16 DIAGNOSIS — S83281A Other tear of lateral meniscus, current injury, right knee, initial encounter: Secondary | ICD-10-CM | POA: Diagnosis not present

## 2016-04-16 DIAGNOSIS — G8918 Other acute postprocedural pain: Secondary | ICD-10-CM | POA: Diagnosis not present

## 2016-04-16 DIAGNOSIS — M94261 Chondromalacia, right knee: Secondary | ICD-10-CM | POA: Diagnosis not present

## 2016-04-16 DIAGNOSIS — M23261 Derangement of other lateral meniscus due to old tear or injury, right knee: Secondary | ICD-10-CM | POA: Diagnosis not present

## 2016-04-16 DIAGNOSIS — M659 Synovitis and tenosynovitis, unspecified: Secondary | ICD-10-CM | POA: Diagnosis not present

## 2016-04-18 DIAGNOSIS — E785 Hyperlipidemia, unspecified: Secondary | ICD-10-CM | POA: Diagnosis not present

## 2016-04-18 DIAGNOSIS — E7219 Other disorders of sulfur-bearing amino-acid metabolism: Secondary | ICD-10-CM | POA: Diagnosis not present

## 2016-04-18 DIAGNOSIS — I10 Essential (primary) hypertension: Secondary | ICD-10-CM | POA: Diagnosis not present

## 2016-04-18 DIAGNOSIS — E039 Hypothyroidism, unspecified: Secondary | ICD-10-CM | POA: Diagnosis not present

## 2016-04-18 DIAGNOSIS — R5383 Other fatigue: Secondary | ICD-10-CM | POA: Diagnosis not present

## 2016-04-18 DIAGNOSIS — E291 Testicular hypofunction: Secondary | ICD-10-CM | POA: Diagnosis not present

## 2016-04-18 DIAGNOSIS — E509 Vitamin A deficiency, unspecified: Secondary | ICD-10-CM | POA: Diagnosis not present

## 2016-04-18 LAB — TESTOSTERONE
FREE TESTOSTERONE: 146.4
Testosterone, total: 623

## 2016-04-18 LAB — BASIC METABOLIC PANEL
BUN: 17 (ref 4–21)
CREATININE: 1.3 (ref <=1.3)
Glucose: 88
Potassium: 4.5 (ref 3.4–5.3)
Sodium: 139 (ref 137–147)

## 2016-04-18 LAB — HEPATIC FUNCTION PANEL
ALK PHOS: 81 (ref 25–125)
ALT: 17 (ref 10–40)
AST: 25 (ref 14–40)
BILIRUBIN, TOTAL: 0.7

## 2016-04-18 LAB — T4, FREE: Free T4: 1.4

## 2016-04-18 LAB — LIPID PANEL
CHOLESTEROL: 238 — AB (ref 0–200)
HDL: 31 — AB (ref 35–70)
LDL CALC: 185
Triglycerides: 97 (ref 40–160)

## 2016-04-18 LAB — CBC AND DIFFERENTIAL
HCT: 48 (ref 41–53)
Hemoglobin: 16.3 (ref 13.5–17.5)
Neutrophils Absolute: 4711
PLATELETS: 205 (ref 150–399)
WBC: 7.8

## 2016-04-18 LAB — VITAMIN D 25 HYDROXY (VIT D DEFICIENCY, FRACTURES): VIT D 25 HYDROXY: 55

## 2016-04-18 LAB — CALCIUM: CALCIUM: 9.1

## 2016-04-18 LAB — ESTIMATED GFR: EGFR (Non-African Amer.): 58

## 2016-04-18 LAB — T3, FREE: FREE T3: 3.1

## 2016-04-18 LAB — FERRITIN: FERRITIN: 66

## 2016-04-18 LAB — ESTRADIOL: Estradiol: 34

## 2016-04-18 LAB — HOMOCYSTEINE: HOMOCYSTEINE: 16

## 2016-04-18 LAB — VITAMIN B12: VITAMIN B 12: 1092

## 2016-04-18 LAB — VITAMIN A: Vitamin A: 65

## 2016-04-18 LAB — PROTEIN, TOTAL: Protein: 6.4

## 2016-04-18 LAB — TSH: TSH: 0.7 (ref <=5.90)

## 2016-05-02 DIAGNOSIS — E7210 Disorders of sulfur-bearing amino-acid metabolism, unspecified: Secondary | ICD-10-CM | POA: Diagnosis not present

## 2016-05-02 DIAGNOSIS — R799 Abnormal finding of blood chemistry, unspecified: Secondary | ICD-10-CM | POA: Diagnosis not present

## 2016-05-02 DIAGNOSIS — E291 Testicular hypofunction: Secondary | ICD-10-CM | POA: Diagnosis not present

## 2016-05-02 DIAGNOSIS — N400 Enlarged prostate without lower urinary tract symptoms: Secondary | ICD-10-CM | POA: Diagnosis not present

## 2016-05-02 DIAGNOSIS — E039 Hypothyroidism, unspecified: Secondary | ICD-10-CM | POA: Diagnosis not present

## 2016-05-02 LAB — BASIC METABOLIC PANEL
BUN: 17 (ref 4–21)
CREATININE: 1.3 (ref <=1.3)
Glucose: 86
Potassium: 4.6 (ref 3.4–5.3)
Sodium: 138 (ref 137–147)

## 2016-05-02 LAB — HEPATIC FUNCTION PANEL
ALK PHOS: 97 (ref 25–125)
ALT: 14 (ref 10–40)
AST: 26 (ref 14–40)
BILIRUBIN, TOTAL: 0.7

## 2016-05-02 LAB — FECAL OCCULT BLOOD, GUAIAC: FECAL OCCULT BLD: NEGATIVE

## 2016-05-02 LAB — PSA: PSA: 3.2

## 2016-05-02 LAB — ESTIMATED GFR: GFR CALC NON AF AMER: 57

## 2016-08-21 DIAGNOSIS — H52223 Regular astigmatism, bilateral: Secondary | ICD-10-CM | POA: Diagnosis not present

## 2016-08-21 DIAGNOSIS — H353131 Nonexudative age-related macular degeneration, bilateral, early dry stage: Secondary | ICD-10-CM | POA: Diagnosis not present

## 2016-08-21 DIAGNOSIS — H524 Presbyopia: Secondary | ICD-10-CM | POA: Diagnosis not present

## 2016-08-21 DIAGNOSIS — H5203 Hypermetropia, bilateral: Secondary | ICD-10-CM | POA: Diagnosis not present

## 2016-08-21 DIAGNOSIS — H5213 Myopia, bilateral: Secondary | ICD-10-CM | POA: Diagnosis not present

## 2016-08-21 LAB — HM DIABETES EYE EXAM

## 2017-03-05 ENCOUNTER — Ambulatory Visit (INDEPENDENT_AMBULATORY_CARE_PROVIDER_SITE_OTHER): Payer: BLUE CROSS/BLUE SHIELD | Admitting: Adult Health

## 2017-03-05 ENCOUNTER — Encounter: Payer: Self-pay | Admitting: Adult Health

## 2017-03-05 VITALS — BP 130/86 | HR 73 | Ht 71.0 in | Wt 283.9 lb

## 2017-03-05 DIAGNOSIS — Z1211 Encounter for screening for malignant neoplasm of colon: Secondary | ICD-10-CM | POA: Diagnosis not present

## 2017-03-05 DIAGNOSIS — I10 Essential (primary) hypertension: Secondary | ICD-10-CM

## 2017-03-05 DIAGNOSIS — E1159 Type 2 diabetes mellitus with other circulatory complications: Secondary | ICD-10-CM

## 2017-03-05 DIAGNOSIS — E291 Testicular hypofunction: Secondary | ICD-10-CM

## 2017-03-05 DIAGNOSIS — Z0000 Encounter for general adult medical examination without abnormal findings: Secondary | ICD-10-CM

## 2017-03-05 DIAGNOSIS — I152 Hypertension secondary to endocrine disorders: Secondary | ICD-10-CM

## 2017-03-05 MED ORDER — METFORMIN HCL ER 500 MG PO TB24: 500.0000 mg | ORAL_TABLET | Freq: Two times a day (BID) | ORAL | 2 refills | Status: DC

## 2017-03-05 MED ORDER — LOSARTAN POTASSIUM 100 MG PO TABS: 100.0000 mg | ORAL_TABLET | Freq: Every day | ORAL | 2 refills | Status: DC

## 2017-03-05 NOTE — Progress Notes (Signed)
Subjective:    Patient ID: Carlos [name], male    DOB: 03/15/1956, 61 y.o.   MRN: [id number]  HPI :  [name] is here to establish as a new pt.  He is a pleasant 61 year old male.  PMH:  HTN, HL, Obesity, impaired glucose tolerance, and hypogonadism.  He follows a heart healthy diet, denies tobacco use and consumes EOTH socially.  He walks at least a mile/day and swims 1/2 hr most days of the week.  He also enjoys bass fishing on weekends. He had 2 surgical procedures on his knees 2017 and was released from [facility] last fall.   Patient Care Team    Relationship Specialty Notifications Start End  Esaw Grandchild, NP PCP - General Family Medicine  03/05/17     Patient Active Problem List   Diagnosis Date Noted  . 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 Nonallopathic lesion-rib cage 03/28/2015  . Nonallopathic lesion of cervical region 03/28/2015  . Nonallopathic lesion of thoracic region 03/28/2015  . Hypogonadism in male 09/15/2007  . Hypertension associated with diabetes ([place]) 09/15/2007  . HEMORRHOIDS, INTERNAL 09/15/2007  . EXTERNAL HEMORRHOIDS 09/15/2007  . DIVERTICULITIS OF COLON 09/15/2007     Past Medical History:  Diagnosis Date  . Hyperlipidemia   . Hypertension   . Low testosterone      Past Surgical History:  Procedure Laterality Date  . COLONOSCOPY    . MENISCUS REPAIR    . ORIF TIBIA PLATEAU Left 11/09/2015   Procedure: OPEN REDUCTION INTERNAL (ORIF) FIXATION LEFT LATERAL TIBIAL PLATEAU;  Surgeon: Justice Britain, MD;  Location: [facility];  Service: Orthopedics;  Laterality: Left;  . ROTATOR CUFF REPAIR Right 2002  . VASECTOMY       Family History  Problem Relation Age of Onset  . Cancer Mother        "male"  . Lung cancer Father   . Bladder Cancer Sister      History  Drug Use No     History  Alcohol Use  . 0.0 oz/week    Comment: occasional/social     History  Smoking Status  . Never Smoker  Smokeless Tobacco  . Never Used      Outpatient Encounter Prescriptions as of 03/05/2017  Medication Sig Note  . Acetylcysteine (NAC 600) 600 MG CAPS Take 600 mg by mouth at bedtime.   Marland Kitchen anastrozole (ARIMIDEX) 1 MG tablet Take 0.5 mg by mouth as directed. 0.5 tablet orally 1 day after testosterone injection   . co-enzyme Q-10 30 MG capsule Take 30 mg by mouth. Tablet 4 caps 5 days a week   . Cyanocobalamin (B-12) 5000 MCG CAPS Take 1 capsule by mouth Nightly.   Marland Kitchen ibuprofen (ADVIL,MOTRIN) 200 MG tablet Take 400 mg by mouth every 6 (six) hours as needed (For pain.).   Marland Kitchen losartan (COZAAR) 100 MG tablet Take 1 tablet (100 mg total) by mouth daily.   . metFORMIN (GLUCOPHAGE-XR) 500 MG 24 hr tablet Take 1 tablet (500 mg total) by mouth 2 (two) times daily.   . NON FORMULARY Take 1,200 mg by mouth Nightly.   . NON FORMULARY Take 50 mg by mouth 2 (two) times daily.   Marland Kitchen PANTETHINE PO Take 4 capsules by mouth 2 (two) times daily.   . Testosterone Cypionate 200 MG/ML KIT Inject 0.5 mLs into the muscle every 7 (seven) days.   . [DISCONTINUED] losartan (COZAAR) 100 MG tablet Take 100 mg by mouth daily.    . [  DISCONTINUED] metFORMIN (GLUCOPHAGE-XR) 500 MG 24 hr tablet Take 500 mg by mouth 2 (two) times daily.   . [DISCONTINUED] anastrozole (ARIMIDEX) 1 MG tablet Take 0.5 mg by mouth daily.   . [DISCONTINUED] diazepam (VALIUM) 5 MG tablet Take 0.5-1 tablets (2.5-5 mg total) by mouth every 6 (six) hours as needed for muscle spasms or sedation.   . [DISCONTINUED] metFORMIN (GLUCOPHAGE-XR) 500 MG 24 hr tablet Take 500 mg by mouth daily with breakfast.    . [DISCONTINUED] niacin (NIASPAN) 500 MG CR tablet Take 500 mg by mouth 3 (three) times daily.    . [DISCONTINUED] ondansetron (ZOFRAN) 4 MG tablet Take 1 tablet (4 mg total) by mouth every 8 (eight) hours as needed for nausea or vomiting.   . [DISCONTINUED] oxyCODONE-acetaminophen (PERCOCET) 5-325 MG tablet Take 1-2 tablets by mouth every 4 (four) hours as needed.   . [DISCONTINUED]  testosterone cypionate (DEPOTESTOSTERONE CYPIONATE) 200 MG/ML injection Inject 100 mg into the muscle every 28 (twenty-eight) days. Take on Saturdays 11/08/2015: Patient is unsure of the strength or how much he takes and we were unable to verify with his pharmacy because call would not go through.   No facility-administered encounter medications on file as of 03/05/2017.     Allergies: Sulfa antibiotics  Body mass index is 39.6 kg/m.  Blood pressure 130/86, pulse 73, height 5' 11" (1.803 m), weight 283 lb 14.4 oz (128.8 kg).    Review of Systems  Constitutional: Positive for fatigue. Negative for activity change, appetite change, chills, diaphoresis, fever and unexpected weight change.  HENT: Negative for congestion.   Eyes: Negative for visual disturbance.  Respiratory: Negative for cough, chest tightness, shortness of breath, wheezing and stridor.   Cardiovascular: Negative for chest pain, palpitations and leg swelling.  Gastrointestinal: Negative for abdominal distention, abdominal pain, blood in stool, constipation, diarrhea, nausea and vomiting.  Endocrine: Negative for cold intolerance, heat intolerance, polydipsia, polyphagia and polyuria.  Genitourinary: Negative for difficulty urinating, flank pain and hematuria.  Musculoskeletal: Positive for back pain and myalgias. Negative for arthralgias, gait problem, joint swelling, neck pain and neck stiffness.  Skin: Negative for color change, pallor, rash and wound.  Neurological: Negative for dizziness, tremors, weakness and headaches.  Hematological: Does not bruise/bleed easily.  Psychiatric/Behavioral: Negative for confusion, hallucinations, self-injury, sleep disturbance and suicidal ideas. The patient is not nervous/anxious and is not hyperactive.        Objective:   Physical Exam  Constitutional: He is oriented to person, place, and time. He appears well-developed and well-nourished. No distress.  HENT:  Head: Normocephalic  and atraumatic.  Right Ear: External ear normal.  Left Ear: External ear normal.  Eyes: Pupils are equal, round, and reactive to light. Conjunctivae are normal.  Cardiovascular: Normal rate, regular rhythm, normal heart sounds and intact distal pulses.   No murmur heard. Pulmonary/Chest: Effort normal and breath sounds normal. No respiratory distress. He has no wheezes. He has no rales. He exhibits no tenderness.  Neurological: He is alert and oriented to person, place, and time. Coordination normal.  Skin: Skin is warm and dry. No rash noted. He is not diaphoretic. No erythema. No pallor.  Psychiatric: He has a normal mood and affect. His behavior is normal. Judgment and thought content normal.  Nursing note and vitals reviewed.         Assessment & Plan:   1. Essential hypertension   2. Hypogonadism in male   3. Healthcare maintenance   4. Screening for colon cancer     5. Hypertension associated with diabetes ([place])     Hypertension associated with diabetes ([place]) BP at goal 130/86 Continue Losartan 141m daily and avoid Na+ in diet.   Hypogonadism in male Testosterone level obtained today. He reports being on injectable Tes.Cypionate 2012mml weekly for >8 years. We will continue therapy if appropriate.  Healthcare maintenance Full set of fasting labs + PSA and Testosterone levels drawn today. Increase water intake, strive for at least gallon/day. Heart healthy diet and continue regular movement-walking,swimming, bass fishing. CPE this fall.    FOLLOW-UP:  Return in about 3 months (around 06/04/2017) for CPE.

## 2017-03-05 NOTE — Patient Instructions (Signed)
Heart-Healthy Eating Plan Many factors influence your heart health, including eating and exercise habits. Heart (coronary) risk increases with abnormal blood fat (lipid) levels. Heart-healthy meal planning includes limiting unhealthy fats, increasing healthy fats, and making other small dietary changes. This includes maintaining a healthy body weight to help keep lipid levels within a normal range. What is my plan? Your health care provider recommends that you:  Get no more than __25__% of the total calories in your daily diet from fat.  Limit your intake of saturated fat to less than __5___% of your total calories each day.  Limit the amount of cholesterol in your diet to less than __300___ mg per day.  What types of fat should I choose?  Choose healthy fats more often. Choose monounsaturated and polyunsaturated fats, such as olive oil and canola oil, flaxseeds, walnuts, almonds, and seeds.  Eat more omega-3 fats. Good choices include salmon, mackerel, sardines, tuna, flaxseed oil, and ground flaxseeds. Aim to eat fish at least two times each week.  Limit saturated fats. Saturated fats are primarily found in animal products, such as meats, butter, and cream. Plant sources of saturated fats include palm oil, palm kernel oil, and coconut oil.  Avoid foods with partially hydrogenated oils in them. These contain trans fats. Examples of foods that contain trans fats are stick margarine, some tub margarines, cookies, crackers, and other baked goods. What general guidelines do I need to follow?  Check food labels carefully to identify foods with trans fats or high amounts of saturated fat.  Fill one half of your plate with vegetables and green salads. Eat 4-5 servings of vegetables per day. A serving of vegetables equals 1 cup of raw leafy vegetables,  cup of raw or cooked cut-up vegetables, or  cup of vegetable juice.  Fill one fourth of your plate with whole grains. Look for the word "whole"  as the first word in the ingredient list.  Fill one fourth of your plate with lean protein foods.  Eat 4-5 servings of fruit per day. A serving of fruit equals one medium whole fruit,  cup of dried fruit,  cup of fresh, frozen, or canned fruit, or  cup of 100% fruit juice.  Eat more foods that contain soluble fiber. Examples of foods that contain this type of fiber are apples, broccoli, carrots, beans, peas, and barley. Aim to get 20-30 g of fiber per day.  Eat more home-cooked food and less restaurant, buffet, and fast food.  Limit or avoid alcohol.  Limit foods that are high in starch and sugar.  Avoid fried foods.  Cook foods by using methods other than frying. Baking, boiling, grilling, and broiling are all great options. Other fat-reducing suggestions include: ? Removing the skin from poultry. ? Removing all visible fats from meats. ? Skimming the fat off of stews, soups, and gravies before serving them. ? Steaming vegetables in water or broth.  Lose weight if you are overweight. Losing just 5-10% of your initial body weight can help your overall health and prevent diseases such as diabetes and heart disease.  Increase your consumption of nuts, legumes, and seeds to 4-5 servings per week. One serving of dried beans or legumes equals  cup after being cooked, one serving of nuts equals 1 ounces, and one serving of seeds equals  ounce or 1 tablespoon.  You may need to monitor your salt (sodium) intake, especially if you have high blood pressure. Talk with your health care provider or dietitian to get  more information about reducing sodium. What foods can I eat? Grains  Breads, including [place], white, pita, wheat, raisin, rye, oatmeal, and [place]. Tortillas that are neither fried nor made with lard or trans fat. Low-fat rolls, including hotdog and hamburger buns and English muffins. Biscuits. Muffins. Waffles. Pancakes. Light popcorn. Whole-grain cereals. Flatbread. Melba  toast. Pretzels. Breadsticks. Rusks. Low-fat snacks and crackers, including oyster, saltine, matzo, graham, animal, and rye. Rice and pasta, including brown rice and those that are made with whole wheat. Vegetables All vegetables. Fruits All fruits, but limit coconut. Meats and Other Protein Sources Lean, well-trimmed beef, veal, pork, and lamb. Chicken and [place] without skin. All fish and shellfish. Wild duck, rabbit, pheasant, and venison. Egg whites or low-cholesterol egg substitutes. Dried beans, peas, lentils, and tofu.Seeds and most nuts. Dairy Low-fat or nonfat cheeses, including ricotta, string, and mozzarella. Skim or 1% milk that is liquid, powdered, or evaporated. Buttermilk that is made with low-fat milk. Nonfat or low-fat yogurt. Beverages Mineral water. Diet carbonated beverages. Sweets and Desserts Sherbets and fruit ices. Honey, jam, marmalade, jelly, and syrups. Meringues and gelatins. Pure sugar candy, such as hard candy, jelly beans, gumdrops, mints, marshmallows, and small amounts of dark chocolate. [employer] Eat all sweets and desserts in moderation. Fats and Oils Nonhydrogenated (trans-free) margarines. Vegetable oils, including soybean, sesame, sunflower, olive, peanut, safflower, corn, canola, and cottonseed. Salad dressings or mayonnaise that are made with a vegetable oil. Limit added fats and oils that you use for cooking, baking, salads, and as spreads. Other Cocoa powder. Coffee and tea. All seasonings and condiments. The items listed above may not be a complete list of recommended foods or beverages. Contact your dietitian for more options. What foods are not recommended? Grains Breads that are made with saturated or trans fats, oils, or whole milk. Croissants. Butter rolls. Cheese breads. Sweet rolls. Donuts. Buttered popcorn. Chow mein noodles. High-fat crackers, such as cheese or butter crackers. Meats and Other Protein Sources Fatty meats, such as  hotdogs, short ribs, sausage, spareribs, bacon, ribeye roast or steak, and mutton. High-fat deli meats, such as salami and bologna. Caviar. Domestic duck and goose. Organ meats, such as kidney, liver, sweetbreads, brains, gizzard, chitterlings, and heart. Dairy Cream, sour cream, cream cheese, and creamed cottage cheese. Whole milk cheeses, including blue (bleu), Monterey Jack, [place], [place], American, [place], Swiss, [place], [place], and [place]. Whole or 2% milk that is liquid, evaporated, or condensed. Whole buttermilk. Cream sauce or high-fat cheese sauce. Yogurt that is made from whole milk. Beverages Regular sodas and drinks with added sugar. Sweets and Desserts Frosting. Pudding. Cookies. Cakes other than angel food cake. Candy that has milk chocolate or white chocolate, hydrogenated fat, butter, coconut, or unknown ingredients. Buttered syrups. Full-fat ice cream or ice cream drinks. Fats and Oils Gravy that has suet, meat fat, or shortening. Cocoa butter, hydrogenated oils, palm oil, coconut oil, palm kernel oil. These can often be found in baked products, candy, fried foods, nondairy creamers, and whipped toppings. Solid fats and shortenings, including bacon fat, salt pork, lard, and butter. Nondairy cream substitutes, such as coffee creamers and sour cream substitutes. Salad dressings that are made of unknown oils, cheese, or sour cream. The items listed above may not be a complete list of foods and beverages to avoid. Contact your dietitian for more information. This information is not intended to replace advice given to you by your health care provider. Make sure you discuss any questions you have with your health care  provider. Document Released: 03/19/2008 Document Revised: 12/29/2015 Document Reviewed: 12/02/2013 Elsevier Interactive Patient Education  2017 [place].   Back Exercises The following exercises strengthen the muscles that help to support the back. They also help to  keep the lower back flexible. Doing these exercises can help to prevent back pain or lessen existing pain. If you have back pain or discomfort, try doing these exercises 2-3 times each day or as told by your health care provider. When the pain goes away, do them once each day, but increase the number of times that you repeat the steps for each exercise (do more repetitions). If you do not have back pain or discomfort, do these exercises once each day or as told by your health care provider. Exercises Single Knee to Chest  Repeat these steps 3-5 times for each leg: Lie on your back on a firm bed or the floor with your legs extended. Bring one knee to your chest. Your other leg should stay extended and in contact with the floor. Hold your knee in place by grabbing your knee or thigh. Pull on your knee until you feel a gentle stretch in your lower back. Hold the stretch for 10-30 seconds. Slowly release and straighten your leg.  Pelvic Tilt  Repeat these steps 5-10 times: Lie on your back on a firm bed or the floor with your legs extended. Bend your knees so they are pointing toward the ceiling and your feet are flat on the floor. Tighten your lower abdominal muscles to press your lower back against the floor. This motion will tilt your pelvis so your tailbone points up toward the ceiling instead of pointing to your feet or the floor. With gentle tension and even breathing, hold this position for 5-10 seconds.  Cat-Cow  Repeat these steps until your lower back becomes more flexible: Get into a hands-and-knees position on a firm surface. Keep your hands under your shoulders, and keep your knees under your hips. You may place padding under your knees for comfort. Let your head hang down, and point your tailbone toward the floor so your lower back becomes rounded like the back of a cat. Hold this position for 5 seconds. Slowly lift your head and point your tailbone up toward the ceiling so your  back forms a sagging arch like the back of a cow. Hold this position for 5 seconds.  Press-Ups  Repeat these steps 5-10 times: Lie on your abdomen (face-down) on the floor. Place your palms near your head, about shoulder-width apart. While you keep your back as relaxed as possible and keep your hips on the floor, slowly straighten your arms to raise the top half of your body and lift your shoulders. Do not use your back muscles to raise your upper torso. You may adjust the placement of your hands to make yourself more comfortable. Hold this position for 5 seconds while you keep your back relaxed. Slowly return to lying flat on the floor.  Bridges  Repeat these steps 10 times: Lie on your back on a firm surface. Bend your knees so they are pointing toward the ceiling and your feet are flat on the floor. Tighten your buttocks muscles and lift your buttocks off of the floor until your waist is at almost the same height as your knees. You should feel the muscles working in your buttocks and the back of your thighs. If you do not feel these muscles, slide your feet 1-2 inches farther away from your  buttocks. Hold this position for 3-5 seconds. Slowly lower your hips to the starting position, and allow your buttocks muscles to relax completely.  If this exercise is too easy, try doing it with your arms crossed over your chest. Abdominal Crunches  Repeat these steps 5-10 times: Lie on your back on a firm bed or the floor with your legs extended. Bend your knees so they are pointing toward the ceiling and your feet are flat on the floor. Cross your arms over your chest. Tip your chin slightly toward your chest without bending your neck. Tighten your abdominal muscles and slowly raise your trunk (torso) high enough to lift your shoulder blades a tiny bit off of the floor. Avoid raising your torso higher than that, because it can put too much stress on your low back and it does not help to  strengthen your abdominal muscles. Slowly return to your starting position.  Back Lifts Repeat these steps 5-10 times: Lie on your abdomen (face-down) with your arms at your sides, and rest your forehead on the floor. Tighten the muscles in your legs and your buttocks. Slowly lift your chest off of the floor while you keep your hips pressed to the floor. Keep the back of your head in line with the curve in your back. Your eyes should be looking at the floor. Hold this position for 3-5 seconds. Slowly return to your starting position.  Contact a health care provider if: Your back pain or discomfort gets much worse when you do an exercise. Your back pain or discomfort does not lessen within 2 hours after you exercise. If you have any of these problems, stop doing these exercises right away. Do not do them again unless your health care provider says that you can. Get help right away if: You develop sudden, severe back pain. If this happens, stop doing the exercises right away. Do not do them again unless your health care provider says that you can. This information is not intended to replace advice given to you by your health care provider. Make sure you discuss any questions you have with your health care provider. Document Released: 07/18/2004 Document Revised: 10/18/2015 Document Reviewed: 08/04/2014 Elsevier Interactive Patient Education  2017 [place] all medications as directed. Increase water intake, strive for at least gallon/day. Recommend daily stretching and as needed heating pad and Epson Salt baths for low back pain/spasm. We will call you when lab results are available. Please schedule full physical this fall/winter.

## 2017-03-05 NOTE — Assessment & Plan Note (Signed)
BP at goal 130/86 Continue Losartan 100mg  daily and avoid Na+ in diet.

## 2017-03-05 NOTE — Assessment & Plan Note (Signed)
Testosterone level obtained today. He reports being on injectable Tes.Cypionate 200mg /ml weekly for >8 years. We will continue therapy if appropriate.

## 2017-03-05 NOTE — Assessment & Plan Note (Signed)
Full set of fasting labs + PSA and Testosterone levels drawn today. Increase water intake, strive for at least gallon/day. Heart healthy diet and continue regular movement-walking,swimming, bass fishing. CPE this fall.

## 2017-03-06 ENCOUNTER — Other Ambulatory Visit: Payer: Self-pay | Admitting: Adult Health

## 2017-03-06 DIAGNOSIS — Z79899 Other long term (current) drug therapy: Secondary | ICD-10-CM

## 2017-03-06 DIAGNOSIS — E7800 Pure hypercholesterolemia, unspecified: Secondary | ICD-10-CM

## 2017-03-06 LAB — COMPREHENSIVE METABOLIC PANEL
A/G RATIO: 1.7 (ref 1.2–2.2)
ALK PHOS: 124 IU/L — AB (ref 39–117)
ALT: 17 IU/L (ref 0–44)
AST: 30 IU/L (ref 0–40)
Albumin: 4.8 g/dL (ref 3.6–4.8)
BILIRUBIN TOTAL: 0.7 mg/dL (ref 0.0–1.2)
BUN/Creatinine Ratio: 11 (ref 10–24)
BUN: 15 mg/dL (ref 8–27)
CHLORIDE: 100 mmol/L (ref 96–106)
CO2: 24 mmol/L (ref 20–29)
Calcium: 10.3 mg/dL — ABNORMAL HIGH (ref 8.6–10.2)
Creatinine, Ser: 1.35 mg/dL — ABNORMAL HIGH (ref 0.76–1.27)
GFR calc Af Amer: 65 mL/min/{1.73_m2} (ref >59)
GFR calc non Af Amer: 56 mL/min/{1.73_m2} — ABNORMAL LOW (ref >59)
GLUCOSE: 95 mg/dL (ref 65–99)
Globulin, Total: 2.8 g/dL (ref 1.5–4.5)
POTASSIUM: 5.4 mmol/L — AB (ref 3.5–5.2)
Sodium: 142 mmol/L (ref 134–144)
Total Protein: 7.6 g/dL (ref 6.0–8.5)

## 2017-03-06 LAB — CBC WITH DIFFERENTIAL/PLATELET
BASOS ABS: 0 10*3/uL (ref 0.0–0.2)
BASOS: 1 %
EOS (ABSOLUTE): 0.1 10*3/uL (ref 0.0–0.4)
Eos: 1 %
Hematocrit: 51.2 % — ABNORMAL HIGH (ref 37.5–51.0)
Hemoglobin: 17.8 g/dL — ABNORMAL HIGH (ref 13.0–17.7)
IMMATURE GRANS (ABS): 0 10*3/uL (ref 0.0–0.1)
Immature Granulocytes: 0 %
LYMPHS: 20 %
Lymphocytes Absolute: 1.2 10*3/uL (ref 0.7–3.1)
MCH: 31.1 pg (ref 26.6–33.0)
MCHC: 34.8 g/dL (ref 31.5–35.7)
MCV: 89 fL (ref 79–97)
MONOS ABS: 0.9 10*3/uL (ref 0.1–0.9)
Monocytes: 14 %
NEUTROS ABS: 3.9 10*3/uL (ref 1.4–7.0)
NEUTROS PCT: 64 %
PLATELETS: 236 10*3/uL (ref 150–379)
RBC: 5.73 x10E6/uL (ref 4.14–5.80)
RDW: 13.7 % (ref 12.3–15.4)
WBC: 6.1 10*3/uL (ref 3.4–10.8)

## 2017-03-06 LAB — LIPID PANEL
CHOL/HDL RATIO: 6.9 ratio — AB (ref 0.0–5.0)
Cholesterol, Total: 243 mg/dL — ABNORMAL HIGH (ref 100–199)
HDL: 35 mg/dL — ABNORMAL LOW (ref >39)
LDL CALC: 181 mg/dL — AB (ref 0–99)
TRIGLYCERIDES: 137 mg/dL (ref 0–149)
VLDL Cholesterol Cal: 27 mg/dL (ref 5–40)

## 2017-03-06 LAB — HEMOGLOBIN A1C
ESTIMATED AVERAGE GLUCOSE: 117 mg/dL
HEMOGLOBIN A1C: 5.7 % — AB (ref 4.8–5.6)

## 2017-03-06 LAB — VITAMIN D 25 HYDROXY (VIT D DEFICIENCY, FRACTURES): VIT D 25 HYDROXY: 60.6 ng/mL (ref 30.0–100.0)

## 2017-03-06 LAB — TESTOSTERONE: Testosterone: 550 ng/dL (ref 264–916)

## 2017-03-06 LAB — TSH: TSH: 1.12 u[IU]/mL (ref 0.450–4.500)

## 2017-03-06 LAB — PSA: PROSTATE SPECIFIC AG, SERUM: 6.1 ng/mL — AB (ref 0.0–4.0)

## 2017-03-06 MED ORDER — ATORVASTATIN CALCIUM 20 MG PO TABS
20.0000 mg | ORAL_TABLET | Freq: Every day | ORAL | 1 refills | Status: DC
Start: 2017-03-06 — End: 2017-10-09

## 2017-03-06 NOTE — Progress Notes (Signed)
Please see lab notes 

## 2017-03-10 ENCOUNTER — Encounter: Payer: Self-pay | Admitting: Adult Health

## 2017-04-04 ENCOUNTER — Encounter: Payer: Self-pay | Admitting: Adult Health

## 2017-05-13 ENCOUNTER — Encounter: Payer: BLUE CROSS/BLUE SHIELD | Admitting: Adult Health

## 2017-06-03 ENCOUNTER — Other Ambulatory Visit: Payer: Self-pay | Admitting: Adult Health

## 2017-06-03 DIAGNOSIS — R972 Elevated prostate specific antigen [PSA]: Secondary | ICD-10-CM

## 2017-06-03 NOTE — Progress Notes (Signed)
Subjective:    Patient ID: Carlos [name], male    DOB: May 03, 1956, 61 y.o.   MRN: [id number]  HPI : 03/05/17 OV:  Carlos [name] is here to establish as a new pt.  He is a pleasant 61 year old male.  PMH:  HTN, HL, Obesity, impaired glucose tolerance, and hypogonadism.  He follows a heart healthy diet, denies tobacco use and consumes EOTH socially.  He walks at least a mile/day and swims 1/2 hr most days of the week.  He also enjoys bass fishing on weekends. He had 2 surgical procedures on his knees 2017 and was released from [facility] last fall.  06/04/17 OV: Carlos [name] is here for CPE.  He denies any acute changes since initial OV in Sept.  He reports that he had sexual intercourse and received a testosterone injection just 2 days prior to labs in drawn in Sept 2018 and wants PSA/Testosterone re-drawn.  He denies urinary sx's and already has a Urology at [facility], however has not seen them in > 18 months. He has been increasing his water intake and estimates to drink > gallon/day. He reports tolerating Atorvastatin 12m (started 9/18), denies myalgia's.  Healthcare Maintenance: Colonoscopy- scheduled 06/20/17   Patient Care Team    Relationship Specialty Notifications Start End  DEsaw Grandchild NP PCP - General Family Medicine  03/05/17     Patient Active Problem List   Diagnosis Date Noted  . Elevated PSA 06/04/2017  . Healthcare maintenance 03/05/2017  . 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 Nonallopathic lesion-rib cage 03/28/2015  . Nonallopathic lesion of cervical region 03/28/2015  . Nonallopathic lesion of thoracic region 03/28/2015  . Hypogonadism in male 09/15/2007  . Hypertension associated with diabetes [place] 09/15/2007  . HEMORRHOIDS, INTERNAL 09/15/2007  . EXTERNAL HEMORRHOIDS 09/15/2007  . DIVERTICULITIS OF COLON 09/15/2007     Past Medical History:  Diagnosis Date  . Hyperlipidemia   . Hypertension   . Low testosterone      Past Surgical History:   Procedure Laterality Date  . COLONOSCOPY    . MENISCUS REPAIR    . ORIF TIBIA PLATEAU Left 11/09/2015   Procedure: OPEN REDUCTION INTERNAL (ORIF) FIXATION LEFT LATERAL TIBIAL PLATEAU;  Surgeon: KJustice Britain MD;  Location: [facility]  Service: Orthopedics;  Laterality: Left;  . ROTATOR CUFF REPAIR Right 2002  . VASECTOMY       Family History  Problem Relation Age of Onset  . Cancer Mother        "male"  . Lung cancer Father   . Bladder Cancer Sister      Social History   Substance and Sexual Activity  Drug Use No     Social History   Substance and Sexual Activity  Alcohol Use Yes  . Alcohol/week: 0.0 oz   Comment: occasional/social     Social History   Tobacco Use  Smoking Status Never Smoker  Smokeless Tobacco Never Used     Outpatient Encounter Medications as of 06/04/2017  Medication Sig  . Acetylcysteine (NAC 600) 600 MG CAPS Take 600 mg by mouth at bedtime.  .Marland Kitchenanastrozole (ARIMIDEX) 1 MG tablet Take 0.5 mg by mouth as directed. 0.5 tablet orally 1 day after testosterone injection  . Ascorbic Acid (VITAMIN C) 1000 MG tablet Take 1,000 mg by mouth 3 (three) times daily.  .Marland Kitchenaspirin 81 MG chewable tablet Chew 81 mg by mouth daily.  .Marland Kitchenatorvastatin (LIPITOR) 20 MG tablet Take 1 tablet (20 mg total)  by mouth daily.  . Cholecalciferol (VITAMIN D-3) 5000 units TABS Take 2 tablets by mouth daily.  Marland Kitchen co-enzyme Q-10 30 MG capsule Take 30 mg by mouth. Tablet 4 caps 5 days a week  . Cyanocobalamin (B-12) 5000 MCG CAPS Take 1 capsule by mouth Nightly.  Marland Kitchen DIOSMIN-HESPERIDIN-GRAPE SEED PO Take 1 tablet by mouth daily.  Marland Kitchen ibuprofen (ADVIL,MOTRIN) 200 MG tablet Take 400 mg by mouth every 6 (six) hours as needed (For pain.).  Marland Kitchen Levomefolic Acid (5-MTHF PO) Take 5,000 mcg by mouth daily.  Marland Kitchen losartan (COZAAR) 100 MG tablet Take 1 tablet (100 mg total) by mouth daily.  Marland Kitchen MAGNESIUM GLUCONATE PO Take by mouth. 5-6 capsules daily  . metFORMIN (GLUCOPHAGE-XR) 500 MG 24 hr tablet  Take 1 tablet (500 mg total) by mouth 2 (two) times daily.  . niacin (NIASPAN) 750 MG CR tablet Take 750 mg by mouth 2 (two) times daily.  Marland Kitchen OVER THE COUNTER MEDICATION Take 20 mg by mouth daily. Zinc glycinate  . OVER THE COUNTER MEDICATION Take 200 mcg by mouth daily. Selenomethionine  . OVER THE COUNTER MEDICATION Take 12.5 mg by mouth. Iodine capsules 2 capsules daily  . OVER THE COUNTER MEDICATION Take 3 tablets by mouth 2 (two) times daily. Metabolic Syngery  . PANTETHINE PO Take 4 capsules by mouth 2 (two) times daily.  . saw palmetto 160 MG capsule Take 320 mg by mouth daily.  . vitamin E 400 UNIT capsule Take 400 Units by mouth daily.  . Testosterone Cypionate 200 MG/ML KIT Inject 0.5 mLs into the muscle every 7 (seven) days.   No facility-administered encounter medications on file as of 06/04/2017.     Allergies: Sulfa antibiotics  Body mass index is 39.81 kg/m.  Blood pressure 136/84, pulse 91, height _0  (1.803 m), weight 285 lb 6.4 oz (129.5 kg).    Review of Systems  Constitutional: Positive for fatigue. Negative for activity change, appetite change, chills, diaphoresis, fever and unexpected weight change.  HENT: Negative for congestion.   Eyes: Negative for visual disturbance.  Respiratory: Negative for cough, chest tightness, shortness of breath, wheezing and stridor.   Cardiovascular: Negative for chest pain, palpitations and leg swelling.  Gastrointestinal: Negative for abdominal distention, abdominal pain, blood in stool, constipation, diarrhea, nausea and vomiting.  Endocrine: Negative for cold intolerance, heat intolerance, polydipsia, polyphagia and polyuria.  Genitourinary: Negative for difficulty urinating, flank pain and hematuria.  Musculoskeletal: Positive for back pain and myalgias. Negative for arthralgias, gait problem, joint swelling, neck pain and neck stiffness.  Skin: Negative for color change, pallor, rash and wound.  Neurological: Negative for  dizziness, tremors, weakness and headaches.  Hematological: Does not bruise/bleed easily.  Psychiatric/Behavioral: Negative for confusion, hallucinations, self-injury, sleep disturbance and suicidal ideas. The patient is not nervous/anxious and is not hyperactive.        Objective:   Physical Exam  Constitutional: He is oriented to person, place, and time. He appears well-developed and well-nourished. No distress.  HENT:  Head: Normocephalic and atraumatic.  Right Ear: Hearing, tympanic membrane, external ear and ear canal normal. Tympanic membrane is not erythematous and not bulging. No decreased hearing is noted.  Left Ear: Tympanic membrane, external ear and ear canal normal. Tympanic membrane is not erythematous and not bulging. No decreased hearing is noted.  Nose: Right sinus exhibits no maxillary sinus tenderness and no frontal sinus tenderness. Left sinus exhibits no maxillary sinus tenderness and no frontal sinus tenderness.  Mouth/Throat: Uvula is midline, oropharynx is  clear and moist and mucous membranes are normal.  Eyes: Conjunctivae are normal. Pupils are equal, round, and reactive to light.  Neck: Normal range of motion. Neck supple.  Cardiovascular: Normal rate, regular rhythm, normal heart sounds and intact distal pulses.  No murmur heard. Pulmonary/Chest: Effort normal and breath sounds normal. No respiratory distress. He has no wheezes. He has no rales. He exhibits no tenderness.  Abdominal: Soft. Bowel sounds are normal. He exhibits no distension and no mass. There is no tenderness. There is no rebound, no guarding and no CVA tenderness.  Protuberant abdomen  Musculoskeletal: Normal range of motion.  Lymphadenopathy:    He has no cervical adenopathy.  Neurological: He is alert and oriented to person, place, and time. Coordination normal.  Skin: Skin is warm and dry. No rash noted. He is not diaphoretic. No erythema. No pallor.  Psychiatric: He has a normal mood and  affect. His behavior is normal. Judgment and thought content normal.  Nursing note and vitals reviewed.         Assessment & Plan:   1. Hypogonadism in male   2. Elevated PSA   3. Encounter for long-term (current) use of high-risk medication   4. Healthcare maintenance     Elevated PSA 02/2017 Ref Range & Units 50moago  Prostate Specific Ag, Serum 0.0 - 4.0 ng/mL 6.1 Abnormally high     Pt called about level in Sept and advised to be seen by existing Urologist- which he didn't. He has hx of elevated PSAs in the past, denies any current urinary sx's. He denies recent sexual intercourse or any rx's that would elevate level. PSA repeated today, if still elevated he will need to be seen by Urologist He verbalized understanding and agreement.  Healthcare maintenance We will call you when your PSA and Testosterone level results are available. Colonoscopy scheduled at end of month. Continue with your excellent water intake and healthy. Continue regular walking and bass fishing. Recommend use of knee sleeve when standing for prolonged period and icing activity. Please return in 1 months for lab appt to re-check lipids.    FOLLOW-UP:  Return in about 6 months (around 12/03/2017) for Regular Follow Up.

## 2017-06-04 ENCOUNTER — Ambulatory Visit (INDEPENDENT_AMBULATORY_CARE_PROVIDER_SITE_OTHER): Payer: BLUE CROSS/BLUE SHIELD | Admitting: Adult Health

## 2017-06-04 ENCOUNTER — Other Ambulatory Visit: Payer: Self-pay | Admitting: Adult Health

## 2017-06-04 ENCOUNTER — Encounter: Payer: Self-pay | Admitting: Adult Health

## 2017-06-04 VITALS — BP 136/84 | HR 91 | Ht 71.0 in | Wt 285.4 lb

## 2017-06-04 DIAGNOSIS — E291 Testicular hypofunction: Secondary | ICD-10-CM | POA: Diagnosis not present

## 2017-06-04 DIAGNOSIS — Z79899 Other long term (current) drug therapy: Secondary | ICD-10-CM

## 2017-06-04 DIAGNOSIS — Z0000 Encounter for general adult medical examination without abnormal findings: Secondary | ICD-10-CM

## 2017-06-04 DIAGNOSIS — E7800 Pure hypercholesterolemia, unspecified: Secondary | ICD-10-CM

## 2017-06-04 DIAGNOSIS — R972 Elevated prostate specific antigen [PSA]: Secondary | ICD-10-CM | POA: Insufficient documentation

## 2017-06-04 NOTE — Assessment & Plan Note (Addendum)
We will call you when your PSA and Testosterone level results are available. Colonoscopy scheduled at end of month. Continue with your excellent water intake and healthy. Continue regular walking and bass fishing. Recommend use of knee sleeve when standing for prolonged period and icing activity. Please return in 1 months for lab appt to re-check lipids.

## 2017-06-04 NOTE — Assessment & Plan Note (Signed)
02/2017 Ref Range & Units 57mo ago  Prostate Specific Ag, Serum 0.0 - 4.0 ng/mL 6.1 Abnormally high     Pt called about level in Sept and advised to be seen by existing Urologist- which he didn't. He has hx of elevated PSAs in the past, denies any current urinary sx's. He denies recent sexual intercourse or any rx's that would elevate level. PSA repeated today, if still elevated he will need to be seen by Urologist He verbalized understanding and agreement.

## 2017-06-04 NOTE — Patient Instructions (Signed)
Heart-Healthy Eating Plan Many factors influence your heart health, including eating and exercise habits. Heart (coronary) risk increases with abnormal blood fat (lipid) levels. Heart-healthy meal planning includes limiting unhealthy fats, increasing healthy fats, and making other small dietary changes. This includes maintaining a healthy body weight to help keep lipid levels within a normal range. What is my plan? Your health care provider recommends that you:  Get no more than __25__% of the total calories in your daily diet from fat.  Limit your intake of saturated fat to less than __5___% of your total calories each day.  Limit the amount of cholesterol in your diet to less than __300___ mg per day.  What types of fat should I choose?  Choose healthy fats more often. Choose monounsaturated and polyunsaturated fats, such as olive oil and canola oil, flaxseeds, walnuts, almonds, and seeds.  Eat more omega-3 fats. Good choices include salmon, mackerel, sardines, tuna, flaxseed oil, and ground flaxseeds. Aim to eat fish at least two times each week.  Limit saturated fats. Saturated fats are primarily found in animal products, such as meats, butter, and cream. Plant sources of saturated fats include palm oil, palm kernel oil, and coconut oil.  Avoid foods with partially hydrogenated oils in them. These contain trans fats. Examples of foods that contain trans fats are stick margarine, some tub margarines, cookies, crackers, and other baked goods. What general guidelines do I need to follow?  Check food labels carefully to identify foods with trans fats or high amounts of saturated fat.  Fill one half of your plate with vegetables and green salads. Eat 4-5 servings of vegetables per day. A serving of vegetables equals 1 cup of raw leafy vegetables,  cup of raw or cooked cut-up vegetables, or  cup of vegetable juice.  Fill one fourth of your plate with whole grains. Look for the word "whole"  as the first word in the ingredient list.  Fill one fourth of your plate with lean protein foods.  Eat 4-5 servings of fruit per day. A serving of fruit equals one medium whole fruit,  cup of dried fruit,  cup of fresh, frozen, or canned fruit, or  cup of 100% fruit juice.  Eat more foods that contain soluble fiber. Examples of foods that contain this type of fiber are apples, broccoli, carrots, beans, peas, and barley. Aim to get 20-30 g of fiber per day.  Eat more home-cooked food and less restaurant, buffet, and fast food.  Limit or avoid alcohol.  Limit foods that are high in starch and sugar.  Avoid fried foods.  Cook foods by using methods other than frying. Baking, boiling, grilling, and broiling are all great options. Other fat-reducing suggestions include: ? Removing the skin from poultry. ? Removing all visible fats from meats. ? Skimming the fat off of stews, soups, and gravies before serving them. ? Steaming vegetables in water or broth.  Lose weight if you are overweight. Losing just 5-10% of your initial body weight can help your overall health and prevent diseases such as diabetes and heart disease.  Increase your consumption of nuts, legumes, and seeds to 4-5 servings per week. One serving of dried beans or legumes equals  cup after being cooked, one serving of nuts equals 1 ounces, and one serving of seeds equals  ounce or 1 tablespoon.  You may need to monitor your salt (sodium) intake, especially if you have high blood pressure. Talk with your health care provider or dietitian to get  more information about reducing sodium. What foods can I eat? Grains  Breads, including [place], white, pita, wheat, raisin, rye, oatmeal, and [place]. Tortillas that are neither fried nor made with lard or trans fat. Low-fat rolls, including hotdog and hamburger buns and English muffins. Biscuits. Muffins. Waffles. Pancakes. Light popcorn. Whole-grain cereals. Flatbread. Melba  toast. Pretzels. Breadsticks. Rusks. Low-fat snacks and crackers, including oyster, saltine, matzo, graham, animal, and rye. Rice and pasta, including brown rice and those that are made with whole wheat. Vegetables All vegetables. Fruits All fruits, but limit coconut. Meats and Other Protein Sources Lean, well-trimmed beef, veal, pork, and lamb. Chicken and [place] without skin. All fish and shellfish. Wild duck, rabbit, pheasant, and venison. Egg whites or low-cholesterol egg substitutes. Dried beans, peas, lentils, and tofu.Seeds and most nuts. Dairy Low-fat or nonfat cheeses, including ricotta, string, and mozzarella. Skim or 1% milk that is liquid, powdered, or evaporated. Buttermilk that is made with low-fat milk. Nonfat or low-fat yogurt. Beverages Mineral water. Diet carbonated beverages. Sweets and Desserts Sherbets and fruit ices. Honey, jam, marmalade, jelly, and syrups. Meringues and gelatins. Pure sugar candy, such as hard candy, jelly beans, gumdrops, mints, marshmallows, and small amounts of dark chocolate. [employer] Eat all sweets and desserts in moderation. Fats and Oils Nonhydrogenated (trans-free) margarines. Vegetable oils, including soybean, sesame, sunflower, olive, peanut, safflower, corn, canola, and cottonseed. Salad dressings or mayonnaise that are made with a vegetable oil. Limit added fats and oils that you use for cooking, baking, salads, and as spreads. Other Cocoa powder. Coffee and tea. All seasonings and condiments. The items listed above may not be a complete list of recommended foods or beverages. Contact your dietitian for more options. What foods are not recommended? Grains Breads that are made with saturated or trans fats, oils, or whole milk. Croissants. Butter rolls. Cheese breads. Sweet rolls. Donuts. Buttered popcorn. Chow mein noodles. High-fat crackers, such as cheese or butter crackers. Meats and Other Protein Sources Fatty meats, such as  hotdogs, short ribs, sausage, spareribs, bacon, ribeye roast or steak, and mutton. High-fat deli meats, such as salami and bologna. Caviar. Domestic duck and goose. Organ meats, such as kidney, liver, sweetbreads, brains, gizzard, chitterlings, and heart. Dairy Cream, sour cream, cream cheese, and creamed cottage cheese. Whole milk cheeses, including blue (bleu), Monterey Jack, [place], [place], American, [place], Swiss, [place], [place], and [place]. Whole or 2% milk that is liquid, evaporated, or condensed. Whole buttermilk. Cream sauce or high-fat cheese sauce. Yogurt that is made from whole milk. Beverages Regular sodas and drinks with added sugar. Sweets and Desserts Frosting. Pudding. Cookies. Cakes other than angel food cake. Candy that has milk chocolate or white chocolate, hydrogenated fat, butter, coconut, or unknown ingredients. Buttered syrups. Full-fat ice cream or ice cream drinks. Fats and Oils Gravy that has suet, meat fat, or shortening. Cocoa butter, hydrogenated oils, palm oil, coconut oil, palm kernel oil. These can often be found in baked products, candy, fried foods, nondairy creamers, and whipped toppings. Solid fats and shortenings, including bacon fat, salt pork, lard, and butter. Nondairy cream substitutes, such as coffee creamers and sour cream substitutes. Salad dressings that are made of unknown oils, cheese, or sour cream. The items listed above may not be a complete list of foods and beverages to avoid. Contact your dietitian for more information. This information is not intended to replace advice given to you by your health care provider. Make sure you discuss any questions you have with your health care  provider. Document Released: 03/19/2008 Document Revised: 12/29/2015 Document Reviewed: 12/02/2013 Elsevier Interactive Patient Education  2017 [employer].  We will call you when your PSA and Testosterone level results are available. Continue with your excellent water  intake and healthy. Continue regular walking and bass fishing. Recommend use of knee sleeve when standing for prolonged period and icing activity. Please return in 1 months for lab appt to re-check lipids. NICE TO SEE YOU!

## 2017-06-05 ENCOUNTER — Encounter: Payer: Self-pay | Admitting: Adult Health

## 2017-06-05 LAB — ALT: ALT: 27 IU/L (ref 0–44)

## 2017-06-05 LAB — PSA: PROSTATE SPECIFIC AG, SERUM: 3.3 ng/mL (ref 0.0–4.0)

## 2017-06-06 ENCOUNTER — Other Ambulatory Visit: Payer: Self-pay

## 2017-06-06 ENCOUNTER — Ambulatory Visit (AMBULATORY_SURGERY_CENTER): Payer: Self-pay | Admitting: *Deleted

## 2017-06-06 VITALS — Ht 71.0 in | Wt 289.2 lb

## 2017-06-06 DIAGNOSIS — Z1211 Encounter for screening for malignant neoplasm of colon: Secondary | ICD-10-CM

## 2017-06-06 MED ORDER — PEG-KCL-NACL-NASULF-NA ASC-C 140 G PO SOLR: 1.0000 | ORAL | 0 refills | Status: DC

## 2017-06-06 NOTE — Progress Notes (Signed)
No egg or soy allergy known to patient  No issues with past sedation with any surgeries  or procedures, no intubation problems  No diet pills per patient No home 02 use per patient  No blood thinners per patient  Pt denies issues with constipation  No A fib or A flutter  EMMI video sent to pt's e mail pt declined   

## 2017-06-12 ENCOUNTER — Telehealth: Payer: Self-pay | Admitting: Adult Health

## 2017-06-12 ENCOUNTER — Other Ambulatory Visit: Payer: Self-pay | Admitting: Adult Health

## 2017-06-12 LAB — SPECIMEN STATUS REPORT

## 2017-06-12 LAB — TESTOSTERONE: TESTOSTERONE: 88 ng/dL — AB (ref 264–916)

## 2017-06-12 MED ORDER — TESTOSTERONE CYPIONATE 200 MG/ML IM SOLN: 100.0000 mg | INTRAMUSCULAR | 2 refills | Status: DC

## 2017-06-12 NOTE — Telephone Encounter (Signed)
Patient called states he requested PSA & a testosterone lab test but results he received were for the PSA and a ALT--patient wishes medical assistant/ provider to explain if ALT is the same as the testosterone-- pls call pt at [phone].  --glh

## 2017-06-13 ENCOUNTER — Other Ambulatory Visit: Payer: Self-pay

## 2017-06-13 DIAGNOSIS — E291 Testicular hypofunction: Secondary | ICD-10-CM

## 2017-06-13 NOTE — Telephone Encounter (Signed)
See communications documented on results notes.  Charyl Bigger, CMA

## 2017-06-20 ENCOUNTER — Encounter: Payer: Self-pay | Admitting: Gastroenterology

## 2017-06-20 ENCOUNTER — Ambulatory Visit (AMBULATORY_SURGERY_CENTER): Payer: BLUE CROSS/BLUE SHIELD | Admitting: Gastroenterology

## 2017-06-20 VITALS — BP 116/74 | HR 56 | Temp 98.0°F | Resp 11 | Ht 71.0 in | Wt 289.0 lb

## 2017-06-20 DIAGNOSIS — Z1211 Encounter for screening for malignant neoplasm of colon: Secondary | ICD-10-CM

## 2017-06-20 DIAGNOSIS — D123 Benign neoplasm of transverse colon: Secondary | ICD-10-CM | POA: Diagnosis not present

## 2017-06-20 DIAGNOSIS — D125 Benign neoplasm of sigmoid colon: Secondary | ICD-10-CM

## 2017-06-20 DIAGNOSIS — K633 Ulcer of intestine: Secondary | ICD-10-CM | POA: Diagnosis not present

## 2017-06-20 DIAGNOSIS — D120 Benign neoplasm of cecum: Secondary | ICD-10-CM

## 2017-06-20 DIAGNOSIS — K5140 Inflammatory polyps of colon without complications: Secondary | ICD-10-CM | POA: Diagnosis not present

## 2017-06-20 HISTORY — PX: COLONOSCOPY: SHX174

## 2017-06-20 MED ORDER — SODIUM CHLORIDE 0.9 % IV SOLN: 500.0000 mL | INTRAVENOUS | Status: DC

## 2017-06-20 NOTE — Progress Notes (Signed)
Report to PACU, RN, vss, BBS= Clear.  

## 2017-06-20 NOTE — Patient Instructions (Signed)
**Handouts given to patient on hemorrhoids, polyps, and diverticulosis**    YOU HAD AN ENDOSCOPIC PROCEDURE TODAY AT [facility]:   Refer to the procedure report that was given to you for any specific questions about what was found during the examination.  If the procedure report does not answer your questions, please call your gastroenterologist to clarify.  If you requested that your care partner not be given the details of your procedure findings, then the procedure report has been included in a sealed envelope for you to review at your convenience later.  YOU SHOULD EXPECT: Some feelings of bloating in the abdomen. Passage of more gas than usual.  Walking can help get rid of the air that was put into your GI tract during the procedure and reduce the bloating. If you had a lower endoscopy (such as a colonoscopy or flexible sigmoidoscopy) you may notice spotting of blood in your stool or on the toilet paper. If you underwent a bowel prep for your procedure, you may not have a normal bowel movement for a few days.  Please Note:  You might notice some irritation and congestion in your nose or some drainage.  This is from the oxygen used during your procedure.  There is no need for concern and it should clear up in a day or so.  SYMPTOMS TO REPORT IMMEDIATELY:   Following lower endoscopy (colonoscopy or flexible sigmoidoscopy):  Excessive amounts of blood in the stool  Significant tenderness or worsening of abdominal pains  Swelling of the abdomen that is new, acute  Fever of 100F or higher  For urgent or emergent issues, a gastroenterologist can be reached at any hour by calling [phone].   DIET:  We do recommend a small meal at first, but then you may proceed to your regular diet.  Drink plenty of fluids but you should avoid alcoholic beverages for 24 hours.  ACTIVITY:  You should plan to take it easy for the rest of today and you should NOT DRIVE or use heavy  machinery until tomorrow (because of the sedation medicines used during the test).    FOLLOW UP: Our staff will call the number listed on your records the next business day following your procedure to check on you and address any questions or concerns that you may have regarding the information given to you following your procedure. If we do not reach you, we will leave a message.  However, if you are feeling well and you are not experiencing any problems, there is no need to return our call.  We will assume that you have returned to your regular daily activities without incident.  If any biopsies were taken you will be contacted by phone or by letter within the next 1-3 weeks.  Please call us at [phone] if you have not heard about the biopsies in 3 weeks.    SIGNATURES/CONFIDENTIALITY: You and/or your care partner have signed paperwork which will be entered into your electronic medical record.  These signatures attest to the fact that that the information above on your After Visit Summary has been reviewed and is understood.  Full responsibility of the confidentiality of this discharge information lies with you and/or your care-partner.YOU HAD AN ENDOSCOPIC PROCEDURE TODAY AT [facility]:   Refer to the procedure report that was given to you for any specific questions about what was found during the examination.  If the procedure report does not answer your questions, please call  your gastroenterologist to clarify.  If you requested that your care partner not be given the details of your procedure findings, then the procedure report has been included in a sealed envelope for you to review at your convenience later.  YOU SHOULD EXPECT: Some feelings of bloating in the abdomen. Passage of more gas than usual.  Walking can help get rid of the air that was put into your GI tract during the procedure and reduce the bloating. If you had a lower endoscopy (such as a colonoscopy or  flexible sigmoidoscopy) you may notice spotting of blood in your stool or on the toilet paper. If you underwent a bowel prep for your procedure, you may not have a normal bowel movement for a few days.  Please Note:  You might notice some irritation and congestion in your nose or some drainage.  This is from the oxygen used during your procedure.  There is no need for concern and it should clear up in a day or so.  SYMPTOMS TO REPORT IMMEDIATELY:   Following lower endoscopy (colonoscopy or flexible sigmoidoscopy):  Excessive amounts of blood in the stool  Significant tenderness or worsening of abdominal pains  Swelling of the abdomen that is new, acute  Fever of 100F or higher   For urgent or emergent issues, a gastroenterologist can be reached at any hour by calling [phone].   DIET:  We do recommend a small meal at first, but then you may proceed to your regular diet.  Drink plenty of fluids but you should avoid alcoholic beverages for 24 hours.  ACTIVITY:  You should plan to take it easy for the rest of today and you should NOT DRIVE or use heavy machinery until tomorrow (because of the sedation medicines used during the test).    FOLLOW UP: Our staff will call the number listed on your records the next business day following your procedure to check on you and address any questions or concerns that you may have regarding the information given to you following your procedure. If we do not reach you, we will leave a message.  However, if you are feeling well and you are not experiencing any problems, there is no need to return our call.  We will assume that you have returned to your regular daily activities without incident.  If any biopsies were taken you will be contacted by phone or by letter within the next 1-3 weeks.  Please call us at [phone] if you have not heard about the biopsies in 3 weeks.    SIGNATURES/CONFIDENTIALITY: You and/or your care partner have signed  paperwork which will be entered into your electronic medical record.  These signatures attest to the fact that that the information above on your After Visit Summary has been reviewed and is understood.  Full responsibility of the confidentiality of this discharge information lies with you and/or your care-partner.

## 2017-06-20 NOTE — Op Note (Signed)
[facility] Patient Name: Carlos [name] Procedure Date: 06/20/2017 7:56 AM MRN: [id number] Endoscopist: Remo Lipps P. Shameek Nyquist MD, MD Age: 61 Referring MD:  Date of Birth: 12-22-1955 Gender: Male Account #: [id number] Procedure:                Colonoscopy Indications:              Screening for colorectal malignant neoplasm Medicines:                Monitored Anesthesia Care Procedure:                Pre-Anesthesia Assessment:                           - Prior to the procedure, a History and Physical                            was performed, and patient medications and                            allergies were reviewed. The patient's tolerance of                            previous anesthesia was also reviewed. The risks                            and benefits of the procedure and the sedation                            options and risks were discussed with the patient.                            All questions were answered, and informed consent                            was obtained. Prior Anticoagulants: The patient has                            taken no previous anticoagulant or antiplatelet                            agents. ASA Grade Assessment: III - A patient with                            severe systemic disease. After reviewing the risks                            and benefits, the patient was deemed in                            satisfactory condition to undergo the procedure.                           After obtaining informed consent, the colonoscope  was passed under direct vision. Throughout the                            procedure, the patient's blood pressure, pulse, and                            oxygen saturations were monitored continuously. The                            Colonoscope was introduced through the anus and                            advanced to the the cecum, identified by                            appendiceal orifice  and ileocecal valve. The                            colonoscopy was performed without difficulty. The                            patient tolerated the procedure well. The quality                            of the bowel preparation was adequate. The                            ileocecal valve, appendiceal orifice, and rectum                            were photographed. Scope In: 8:03:26 AM Scope Out: 8:26:08 AM Scope Withdrawal Time: 0 hours 19 minutes 47 seconds  Total Procedure Duration: 0 hours 22 minutes 42 seconds  Findings:                 The perianal and digital rectal examinations were                            normal.                           Two sessile polyps were found in the cecum. The                            polyps were 3 to 4 mm in size. These polyps were                            removed with a cold snare. Resection and retrieval                            were complete.                           A 3 mm polyp was found in the hepatic flexure. The  polyp was sessile. The polyp was removed with a                            cold snare. Resection and retrieval were complete.                           A 5 mm polyp was found in the transverse colon. The                            polyp was sessile. The polyp was removed with a                            cold snare. Resection and retrieval were complete.                           A 4 mm polyp was found in the sigmoid colon. The                            polyp was sessile. The polyp was removed with a                            cold snare. Resection and retrieval were complete.                           A 3 to 4 mm polyp was found within a diverticulum                            in the sigmoid colon. The polyp was sessile and                            appeared inflammatory and not adenomatous. Biopsies                            were taken with a cold forceps for histology.                            Multiple medium-mouthed diverticula were found in                            the sigmoid colon, descending colon and ascending                            colon.                           Internal hemorrhoids were found during retroflexion.                           The exam was otherwise without abnormality. Complications:            No immediate complications. Estimated blood loss:  Minimal. Estimated Blood Loss:     Estimated blood loss was minimal. Impression:               - Two 3 to 4 mm polyps in the cecum, removed with a                            cold snare. Resected and retrieved.                           - One 3 mm polyp at the hepatic flexure, removed                            with a cold snare. Resected and retrieved.                           - One 5 mm polyp in the transverse colon, removed                            with a cold snare. Resected and retrieved.                           - One 4 mm polyp in the sigmoid colon, removed with                            a cold snare. Resected and retrieved.                           - One 3 to 4 mm polyp in the sigmoid colon within a                            diverticulum - suspect inflammatory in nature and                            not adenomatous. Biopsied.                           - Diverticulosis in the sigmoid colon, in the                            descending colon and in the ascending colon.                           - Internal hemorrhoids.                           - The examination was otherwise normal. Recommendation:           - Patient has a contact number available for                            emergencies. The signs and symptoms of potential                            delayed complications were discussed with  the                            patient. Return to normal activities tomorrow.                            Written discharge instructions were provided to the                             patient.                           - Resume previous diet.                           - Continue present medications.                           - Await pathology results.                           - Repeat colonoscopy is recommended for                            surveillance. The colonoscopy date will be                            determined after pathology results from today's                            exam become available for review.                           - No ibuprofen, naproxen, or other non-steroidal                            anti-inflammatory drugs for 2 weeks after polyp                            removal. Remo Lipps P. Amyre Segundo MD, MD 06/20/2017 8:31:28 AM This report has been signed electronically.

## 2017-06-20 NOTE — Progress Notes (Signed)
Called to room to assist during endoscopic procedure.  Patient ID and intended procedure confirmed with present staff. Received instructions for my participation in the procedure from the performing physician.  

## 2017-06-23 ENCOUNTER — Telehealth: Payer: Self-pay

## 2017-06-23 NOTE — Telephone Encounter (Signed)
  Follow up Call-  Call back number 06/20/2017  Post procedure Call Back phone  # [phone]  Permission to leave phone message Yes  Some recent data might be hidden     Patient questions:  Do you have a fever, pain , or abdominal swelling? No. Pain Score  0 *  Have you tolerated food without any problems? Yes.    Have you been able to return to your normal activities? Yes.    Do you have any questions about your discharge instructions: Diet   No. Medications  No. Follow up visit  No.  Do you have questions or concerns about your Care? No.  Actions: * If pain score is 4 or above: No action needed, pain <4.

## 2017-06-26 ENCOUNTER — Encounter: Payer: Self-pay | Admitting: Gastroenterology

## 2017-06-30 ENCOUNTER — Encounter: Payer: Self-pay | Admitting: Adult Health

## 2017-07-08 ENCOUNTER — Telehealth: Payer: Self-pay | Admitting: Adult Health

## 2017-07-08 ENCOUNTER — Other Ambulatory Visit: Payer: BLUE CROSS/BLUE SHIELD

## 2017-07-08 DIAGNOSIS — Z79899 Other long term (current) drug therapy: Secondary | ICD-10-CM

## 2017-07-08 DIAGNOSIS — E291 Testicular hypofunction: Secondary | ICD-10-CM

## 2017-07-08 DIAGNOSIS — E785 Hyperlipidemia, unspecified: Principal | ICD-10-CM

## 2017-07-08 DIAGNOSIS — E1169 Type 2 diabetes mellitus with other specified complication: Secondary | ICD-10-CM | POA: Diagnosis not present

## 2017-07-08 NOTE — Progress Notes (Signed)
Pt came in this morning for labs and stated that he did not want the testosterone done because he had a testosterone shot on 07/05/17.  Would you like for me to cancel this lab?  Charyl Bigger, CMA  Pt informed of test being cancelled and we will check testosterone 3/19.  Pt expressed understanding and is agreeable.  Charyl Bigger, CMA

## 2017-07-08 NOTE — Telephone Encounter (Signed)
See additional documentation completed today.  Charyl Bigger, CMA

## 2017-07-08 NOTE — Telephone Encounter (Signed)
Patient came in for labs today and after getting blood drawn came back out into the  Waiting area very frustrated. He was told in addition to his lipid panel we were testing his testosterone and is questioning why, as he just had an injection this past Saturday so he is expecting his valuables to be higher than normal and not a true accurate reading. He wants to speak with someone clinical today to get this addressed.

## 2017-07-08 NOTE — Progress Notes (Signed)
Good Morning [name], He has Testosterone level checked 05/2017, level low and was re-started on Testosterone injections. I told him we will need to re-check level in 3 months, so it is not due until 08/2017- so yes please cxl today's draw. If his level is not above high normal at 3 months, we can stretch out to every 6 months. Can you please put in future test level for 08/2017? Thanks! Valetta Fuller

## 2017-07-09 ENCOUNTER — Other Ambulatory Visit: Payer: BLUE CROSS/BLUE SHIELD

## 2017-07-09 LAB — LIPID PANEL
Chol/HDL Ratio: 5.4 ratio — ABNORMAL HIGH (ref 0.0–5.0)
Cholesterol, Total: 173 mg/dL (ref 100–199)
HDL: 32 mg/dL — AB (ref >39)
LDL Calculated: 107 mg/dL — ABNORMAL HIGH (ref 0–99)
TRIGLYCERIDES: 172 mg/dL — AB (ref 0–149)
VLDL CHOLESTEROL CAL: 34 mg/dL (ref 5–40)

## 2017-07-09 LAB — HEPATIC FUNCTION PANEL
ALK PHOS: 109 IU/L (ref 39–117)
ALT: 16 IU/L (ref 0–44)
AST: 26 IU/L (ref 0–40)
Albumin: 4.4 g/dL (ref 3.6–4.8)
Bilirubin Total: 0.8 mg/dL (ref 0.0–1.2)
Bilirubin, Direct: 0.18 mg/dL (ref 0.00–0.40)
TOTAL PROTEIN: 6.8 g/dL (ref 6.0–8.5)

## 2017-08-14 ENCOUNTER — Encounter: Payer: Self-pay | Admitting: Adult Health

## 2017-09-10 ENCOUNTER — Other Ambulatory Visit (INDEPENDENT_AMBULATORY_CARE_PROVIDER_SITE_OTHER): Payer: BLUE CROSS/BLUE SHIELD

## 2017-09-10 DIAGNOSIS — E291 Testicular hypofunction: Secondary | ICD-10-CM | POA: Diagnosis not present

## 2017-09-10 NOTE — Addendum Note (Signed)
Addended by: Fonnie Mu on: 09/10/2017 08:12 AM   Modules accepted: Orders

## 2017-09-11 LAB — TESTOSTERONE: TESTOSTERONE: 446 ng/dL (ref 264–916)

## 2017-09-11 LAB — PSA: PROSTATE SPECIFIC AG, SERUM: 3.3 ng/mL (ref 0.0–4.0)

## 2017-10-09 ENCOUNTER — Other Ambulatory Visit: Payer: Self-pay | Admitting: Adult Health

## 2017-11-17 IMAGING — RF DG KNEE 1-2V*L*
1 series · 3 of 3 positions shown · non-contrast
Comparison: None.

CLINICAL DATA: 60-year-old male undergoing ORIF left tibia plateau.
Initial encounter.

EXAM:
LEFT KNEE - 1-2 VIEW; DG C-ARM 61-120 MIN

[Series 1: run · 3 of 3 slices shown]
[im 1/3]
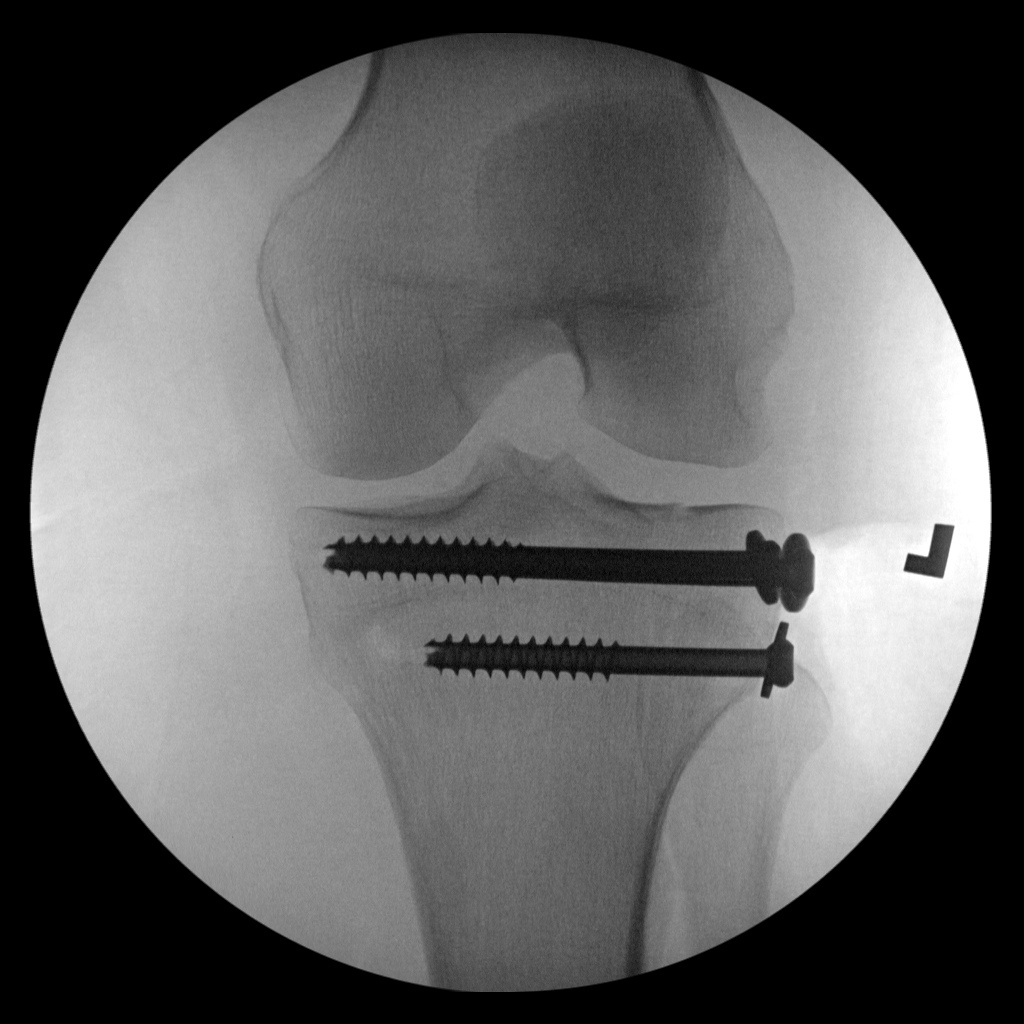
[im 2/3]
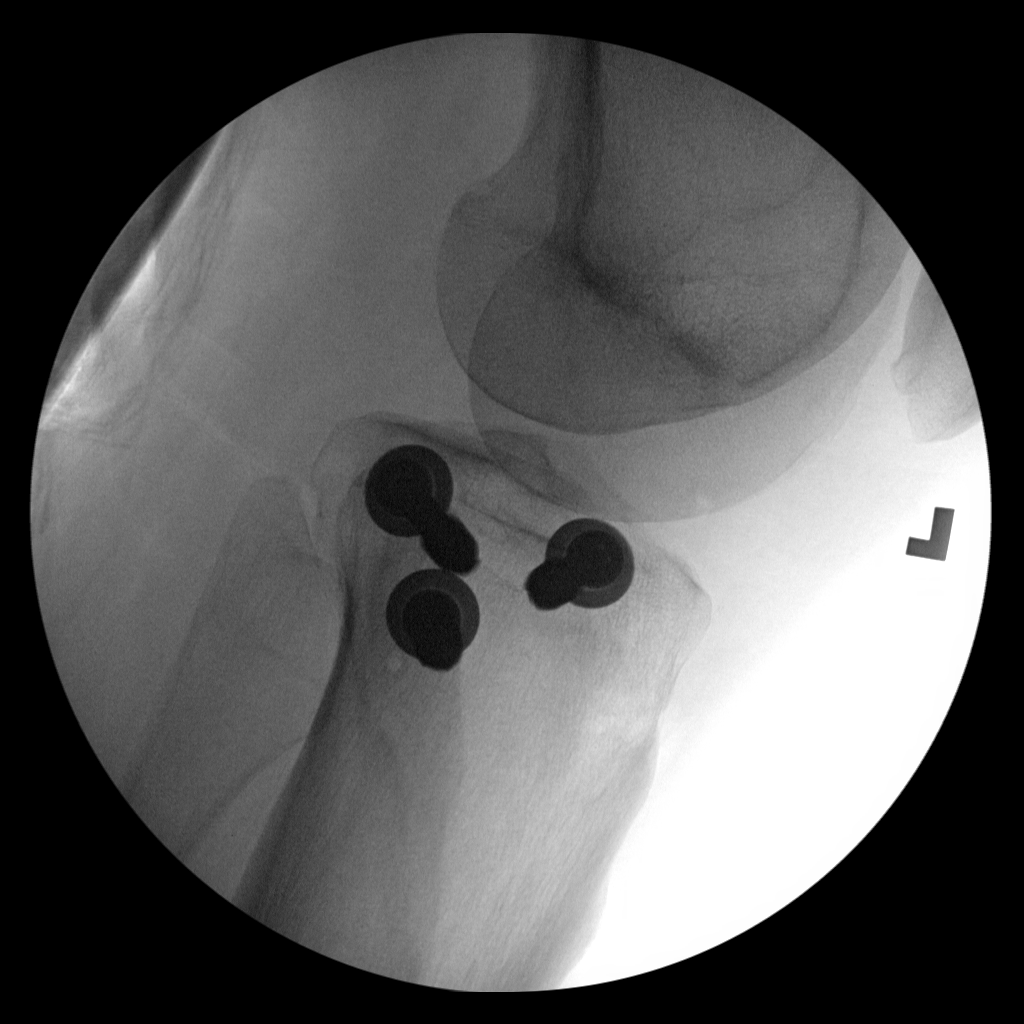
[im 3/3]
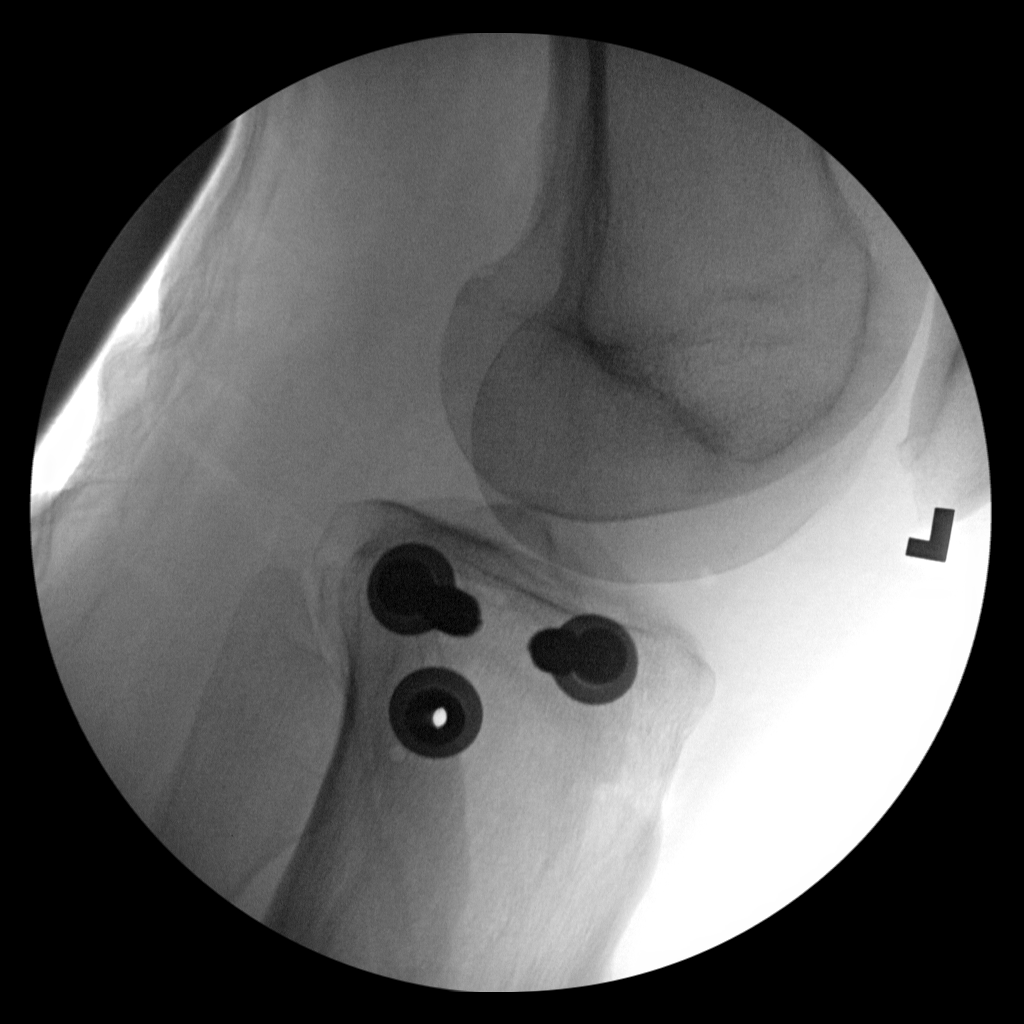

[3 of 3 positions shown; findings below may reference images not displayed]

FINDINGS: Three fluoroscopic intraoperative images of the left knee. 3
cannulated screws traverse the left tibial plateau from lateral to
medial. Hardware appears intact. A minimally displaced lateral
tibial plateau fracture is evident on image 1. Alignment at the left
knee appears preserved.

FLUOROSCOPY TIME:  0 minutes 23 seconds
IMPRESSION: Three cannulated screws traverse a lateral left tibial plateau
fracture with no adverse features.

## 2018-01-07 LAB — HM DIABETES EYE EXAM

## 2018-03-14 ENCOUNTER — Other Ambulatory Visit: Payer: Self-pay | Admitting: Adult Health

## 2018-03-30 ENCOUNTER — Telehealth: Payer: Self-pay | Admitting: Adult Health

## 2018-03-30 NOTE — Telephone Encounter (Signed)
Left message with pt's wife fr him to call office to set up provider required OV for Rx refills.  --forwarding message to medical assistant as an [place].  --glh

## 2018-03-30 NOTE — Telephone Encounter (Signed)
Pt needs appt with [facility] for f/u as well.  Please call pt back and have him schedule an OV.  Charyl Bigger, CMA

## 2018-03-30 NOTE — Telephone Encounter (Signed)
Fonnie Mu, CMA  Elberta Spaniel        Please call pt for f/u. No further refills until pt is seen. Needs to come fasting    --pt set up lab appt for 04/14/18--glh --forwarding message to medical assistant. -glh

## 2018-04-10 ENCOUNTER — Other Ambulatory Visit: Payer: Self-pay | Admitting: Adult Health

## 2018-04-14 ENCOUNTER — Other Ambulatory Visit (INDEPENDENT_AMBULATORY_CARE_PROVIDER_SITE_OTHER): Payer: BLUE CROSS/BLUE SHIELD

## 2018-04-14 ENCOUNTER — Other Ambulatory Visit: Payer: Self-pay

## 2018-04-14 DIAGNOSIS — I152 Hypertension secondary to endocrine disorders: Secondary | ICD-10-CM

## 2018-04-14 DIAGNOSIS — E1159 Type 2 diabetes mellitus with other circulatory complications: Secondary | ICD-10-CM

## 2018-04-14 DIAGNOSIS — Z0000 Encounter for general adult medical examination without abnormal findings: Secondary | ICD-10-CM | POA: Diagnosis not present

## 2018-04-14 DIAGNOSIS — I10 Essential (primary) hypertension: Principal | ICD-10-CM

## 2018-04-14 DIAGNOSIS — R972 Elevated prostate specific antigen [PSA]: Secondary | ICD-10-CM

## 2018-04-14 DIAGNOSIS — E291 Testicular hypofunction: Secondary | ICD-10-CM

## 2018-04-15 LAB — CBC WITH DIFFERENTIAL/PLATELET
BASOS ABS: 0.1 10*3/uL (ref 0.0–0.2)
Basos: 1 %
EOS (ABSOLUTE): 0.1 10*3/uL (ref 0.0–0.4)
Eos: 1 %
HEMOGLOBIN: 18 g/dL — AB (ref 13.0–17.7)
Hematocrit: 54.5 % — ABNORMAL HIGH (ref 37.5–51.0)
Immature Grans (Abs): 0 10*3/uL (ref 0.0–0.1)
Immature Granulocytes: 0 %
LYMPHS ABS: 1.4 10*3/uL (ref 0.7–3.1)
Lymphs: 21 %
MCH: 29.6 pg (ref 26.6–33.0)
MCHC: 33 g/dL (ref 31.5–35.7)
MCV: 90 fL (ref 79–97)
MONOCYTES: 11 %
Monocytes Absolute: 0.7 10*3/uL (ref 0.1–0.9)
NEUTROS ABS: 4.2 10*3/uL (ref 1.4–7.0)
Neutrophils: 66 %
PLATELETS: 203 10*3/uL (ref 150–450)
RBC: 6.09 x10E6/uL — AB (ref 4.14–5.80)
RDW: 12.9 % (ref 12.3–15.4)
WBC: 6.5 10*3/uL (ref 3.4–10.8)

## 2018-04-15 LAB — COMPREHENSIVE METABOLIC PANEL
A/G RATIO: 1.7 (ref 1.2–2.2)
ALK PHOS: 103 IU/L (ref 39–117)
ALT: 17 IU/L (ref 0–44)
AST: 28 IU/L (ref 0–40)
Albumin: 4.7 g/dL (ref 3.6–4.8)
BILIRUBIN TOTAL: 0.7 mg/dL (ref 0.0–1.2)
BUN/Creatinine Ratio: 14 (ref 10–24)
BUN: 19 mg/dL (ref 8–27)
CHLORIDE: 102 mmol/L (ref 96–106)
CO2: 23 mmol/L (ref 20–29)
Calcium: 10 mg/dL (ref 8.6–10.2)
Creatinine, Ser: 1.39 mg/dL — ABNORMAL HIGH (ref 0.76–1.27)
GFR calc non Af Amer: 54 mL/min/{1.73_m2} — ABNORMAL LOW (ref >59)
GFR, EST AFRICAN AMERICAN: 62 mL/min/{1.73_m2} (ref >59)
GLUCOSE: 97 mg/dL (ref 65–99)
Globulin, Total: 2.7 g/dL (ref 1.5–4.5)
POTASSIUM: 5.5 mmol/L — AB (ref 3.5–5.2)
Sodium: 142 mmol/L (ref 134–144)
Total Protein: 7.4 g/dL (ref 6.0–8.5)

## 2018-04-15 LAB — HEMOGLOBIN A1C
ESTIMATED AVERAGE GLUCOSE: 114 mg/dL
HEMOGLOBIN A1C: 5.6 % (ref 4.8–5.6)

## 2018-04-15 LAB — PSA: PROSTATE SPECIFIC AG, SERUM: 3.5 ng/mL (ref 0.0–4.0)

## 2018-04-15 LAB — LIPID PANEL
CHOL/HDL RATIO: 5.4 ratio — AB (ref 0.0–5.0)
Cholesterol, Total: 163 mg/dL (ref 100–199)
HDL: 30 mg/dL — ABNORMAL LOW (ref >39)
LDL CALC: 107 mg/dL — AB (ref 0–99)
Triglycerides: 131 mg/dL (ref 0–149)
VLDL Cholesterol Cal: 26 mg/dL (ref 5–40)

## 2018-04-15 LAB — TSH: TSH: 1.38 u[IU]/mL (ref 0.450–4.500)

## 2018-04-16 ENCOUNTER — Other Ambulatory Visit: Payer: Self-pay | Admitting: Adult Health

## 2018-04-16 MED ORDER — HYDROCHLOROTHIAZIDE 12.5 MG PO TABS: 12.5000 mg | ORAL_TABLET | Freq: Every day | ORAL | 0 refills | Status: DC

## 2018-04-16 MED ORDER — ATORVASTATIN CALCIUM 40 MG PO TABS: 40.0000 mg | ORAL_TABLET | Freq: Every day | ORAL | 1 refills | Status: DC

## 2018-04-20 ENCOUNTER — Telehealth: Payer: Self-pay

## 2018-04-20 ENCOUNTER — Other Ambulatory Visit: Payer: Self-pay | Admitting: Adult Health

## 2018-04-20 MED ORDER — TESTOSTERONE CYPIONATE 200 MG/ML IM SOLN: 100.0000 mg | INTRAMUSCULAR | 0 refills | Status: DC

## 2018-04-20 NOTE — Telephone Encounter (Signed)
Pt wants to know if a new testosterone prescription can be sent to the drug store since his PSA looks okay ?   He would also like to know if donating blood help with the hemoglobin and hematocrit levels?  Please advise.  Charyl Bigger, CMA

## 2018-04-20 NOTE — Telephone Encounter (Signed)
Good Evening [name], Please call/Send MyChart message to [name]- I sent in RF on Testosterone, with reduced volume since he has not followed as advised and we can discuss this at his CPE next month. Yes, blood donation would help lower his H/H and the [facility] would certainly appreciate his unit. Thanks! Valetta Fuller

## 2018-04-21 NOTE — Telephone Encounter (Signed)
MyChart message sent to pt with response.  Charyl Bigger, CMA

## 2018-04-22 ENCOUNTER — Other Ambulatory Visit: Payer: Self-pay | Admitting: Adult Health

## 2018-05-06 ENCOUNTER — Ambulatory Visit: Payer: BLUE CROSS/BLUE SHIELD | Admitting: Adult Health

## 2018-06-16 ENCOUNTER — Other Ambulatory Visit: Payer: Self-pay | Admitting: Adult Health

## 2018-06-19 NOTE — Progress Notes (Signed)
Subjective:    Patient ID: Carlos [name], male    DOB: 04-16-1956, 62 y.o.   MRN: [id number]  HPI : 03/05/17 OV:  Carlos [name] is here to establish as a new pt.  He is a pleasant 62 year old male.  PMH:  HTN, HL, Obesity, impaired glucose tolerance, and hypogonadism.  He follows a heart healthy diet, denies tobacco use and consumes EOTH socially.  He walks at least a mile/day and swims 1/2 hr most days of the week.  He also enjoys bass fishing on weekends. He had 2 surgical procedures on his knees 2017 and was released from [place] last fall.  06/04/17 OV: Carlos [name] is here for CPE.  He denies any acute changes since initial OV in Sept.  He reports that he had sexual intercourse and received a testosterone injection just 2 days prior to labs in drawn in Sept 2018 and wants PSA/Testosterone re-drawn.  He denies urinary sx's and already has a Urology at [facility], however has not seen them in > 18 months. He has been increasing his water intake and estimates to drink > gallon/day. He reports tolerating Atorvastatin 20mg  (started 9/18), denies myalgia's.  06/22/18 OV: Carlos [name] is here for f/u: HTN, HLD, T2D, Obesity Atorvastatin was increased from 20mg  to 40mg  Oct 2019 due to LDL 107 He denies myalgia's  He reports only taking Metformin 500mg  QD, not BID, he is not checking his BG at home He denies episodes of hypoglycemia He reports sig stress at work and believes that is reason for elevated BP- 160/90, repeat 152/82 He reports home BP readings- SBP 120-130s DBP 70-90s He reports "walking a lot at work", denies regular exercise and has no plans on starting a regular regime due to bil lower ext pain and demanding work schedule He denies tobacco/vape/ETOH use Lab Results  Component Value Date   HGBA1C 5.6 04/14/2018   HGBA1C 5.7 (H) 03/05/2017   HGBA1C 5.5 10/16/2015    Patient Care Team    Relationship Specialty Notifications Start End  Esaw Grandchild, NP PCP - General Family  Medicine  03/05/17     Patient Active Problem List   Diagnosis Date Noted  . Pre-diabetes 06/22/2018  . Elevated PSA 06/04/2017  . Healthcare maintenance 03/05/2017  . 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 Nonallopathic lesion-rib cage 03/28/2015  . Nonallopathic lesion of cervical region 03/28/2015  . Nonallopathic lesion of thoracic region 03/28/2015  . Hypogonadism in male 09/15/2007  . Hypertension associated with diabetes ([place]) 09/15/2007  . HEMORRHOIDS, INTERNAL 09/15/2007  . EXTERNAL HEMORRHOIDS 09/15/2007  . DIVERTICULITIS OF COLON 09/15/2007     Past Medical History:  Diagnosis Date  . Diabetes mellitus without complication ([place])    pre-diabetic  . Hyperlipidemia   . Hypertension   . Low testosterone      Past Surgical History:  Procedure Laterality Date  . COLONOSCOPY    . MENISCUS REPAIR    . ORIF TIBIA PLATEAU Left 11/09/2015   Procedure: OPEN REDUCTION INTERNAL (ORIF) FIXATION LEFT LATERAL TIBIAL PLATEAU;  Surgeon: Justice Britain, MD;  Location: [facility];  Service: Orthopedics;  Laterality: Left;  . ROTATOR CUFF REPAIR Right 2002  . VASECTOMY       Family History  Problem Relation Age of Onset  . Cancer Mother        "male"  . Lung cancer Father   . Bladder Cancer Sister   . Colon cancer Neg Hx   . Colon polyps Neg  Hx   . Esophageal cancer Neg Hx   . Rectal cancer Neg Hx   . Stomach cancer Neg Hx      Social History   Substance and Sexual Activity  Drug Use No     Social History   Substance and Sexual Activity  Alcohol Use Yes  . Alcohol/week: 0.0 standard drinks   Comment: occasional/social     Social History   Tobacco Use  Smoking Status Never Smoker  Smokeless Tobacco Never Used     Outpatient Encounter Medications as of 06/22/2018  Medication Sig  . Ascorbic Acid (VITAMIN C) 1000 MG tablet Take 1,000 mg by mouth 3 (three) times daily.  Marland Kitchen atorvastatin (LIPITOR) 40 MG tablet Take 1 tablet (40 mg total) by mouth daily.  .  Cholecalciferol (VITAMIN D-3) 5000 units TABS Take 2 tablets by mouth daily.  Marland Kitchen co-enzyme Q-10 30 MG capsule Take 30 mg by mouth daily. Tablet 4 caps 5 days a week   . hydrochlorothiazide (MICROZIDE) 12.5 MG capsule Take 1 capsule (12.5 mg total) by mouth daily. PATIENT MUST HAVE OFFICE VISIT PRIOR TO ANY FURTHER REFILLS  . ibuprofen (ADVIL,MOTRIN) 200 MG tablet Take 400 mg by mouth every 6 (six) hours as needed (For pain.).  Marland Kitchen losartan (COZAAR) 100 MG tablet Take 1 tablet (100 mg total) by mouth daily.  Marland Kitchen MAGNESIUM GLUCONATE PO Take by mouth. 5-6 capsules daily  . metFORMIN (GLUCOPHAGE-XR) 500 MG 24 hr tablet Take 1 tablet (500 mg total) by mouth 2 (two) times daily.  Marland Kitchen OVER THE COUNTER MEDICATION Take 3 tablets by mouth 2 (two) times daily. Metabolic Syngery   No facility-administered encounter medications on file as of 06/22/2018.     Allergies: Sulfa antibiotics  Body mass index is 39.5 kg/m.  Blood pressure (!) 158/82, pulse 75, temperature 98.4 F (36.9 C), temperature source Oral, height 5\' 11"  (1.803 m), weight 283 lb 3.2 oz (128.5 kg), SpO2 96 %.  Review of Systems  Constitutional: Positive for fatigue. Negative for activity change, appetite change, chills, diaphoresis, fever and unexpected weight change.  HENT: Negative for congestion.   Eyes: Negative for visual disturbance.  Respiratory: Negative for cough, chest tightness, shortness of breath, wheezing and stridor.   Cardiovascular: Negative for chest pain, palpitations and leg swelling.  Gastrointestinal: Negative for abdominal distention, abdominal pain, blood in stool, constipation, diarrhea, nausea and vomiting.  Endocrine: Negative for cold intolerance, heat intolerance, polydipsia, polyphagia and polyuria.  Genitourinary: Negative for difficulty urinating, flank pain and hematuria.  Musculoskeletal: Positive for back pain and myalgias. Negative for arthralgias, gait problem, joint swelling, neck pain and neck  stiffness.  Skin: Negative for color change, pallor, rash and wound.  Neurological: Negative for dizziness, tremors, weakness and headaches.  Hematological: Does not bruise/bleed easily.  Psychiatric/Behavioral: Negative for confusion, hallucinations, self-injury, sleep disturbance and suicidal ideas. The patient is not nervous/anxious and is not hyperactive.        Objective:   Physical Exam  Constitutional: He is oriented to person, place, and time. He appears well-developed and well-nourished. No distress.  HENT:  Head: Normocephalic and atraumatic.  Right Ear: Hearing, tympanic membrane, external ear and ear canal normal. Tympanic membrane is not erythematous and not bulging. No decreased hearing is noted.  Left Ear: Tympanic membrane, external ear and ear canal normal. Tympanic membrane is not erythematous and not bulging. No decreased hearing is noted.  Nose: Right sinus exhibits no maxillary sinus tenderness and no frontal sinus tenderness. Left sinus exhibits no  maxillary sinus tenderness and no frontal sinus tenderness.  Mouth/Throat: Uvula is midline, oropharynx is clear and moist and mucous membranes are normal.  Eyes: Pupils are equal, round, and reactive to light. Conjunctivae and EOM are normal.  Neck: Normal range of motion. Neck supple.  Cardiovascular: Normal rate, regular rhythm, normal heart sounds and intact distal pulses.  No murmur heard. Pulmonary/Chest: Effort normal and breath sounds normal. No respiratory distress. He has no wheezes. He has no rales. He exhibits no tenderness.  Abdominal: There is no CVA tenderness.  Musculoskeletal: Normal range of motion.  Lymphadenopathy:    He has no cervical adenopathy.  Neurological: He is alert and oriented to person, place, and time. Coordination normal.  Skin: Skin is warm and dry. No rash noted. He is not diaphoretic. No erythema. No pallor.  Psychiatric: He has a normal mood and affect. His behavior is normal. Judgment  and thought content normal.  Nursing note and vitals reviewed.    Assessment & Plan:   1. Encounter for long-term (current) use of high-risk medication   2. Healthcare maintenance   3. Pre-diabetes   4. Hypogonadism in male     Healthcare maintenance Continue all medications as directed. Continue to drink plenty of water and follow diabetic diet. Try to increase regular exercise- activities that will not aggravate knee pain. We will call you when lab results are available. If blood pressure is not at goal at follow-up (<130/80) then we will discuss adding an additional medicine- but you can help lower your pressure with weight loss and regular exercise.  Please schedule complete physical in 3 months.  Pre-diabetes Lab Results  Component Value Date   HGBA1C 5.6 04/14/2018   HGBA1C 5.7 (H) 03/05/2017   HGBA1C 5.5 10/16/2015   Currently taking Metformin Declined referral to [facility] [place] referral to PT to help with mobility and weigh loss  Hypogonadism in male Due to elevated H/H will not continue testosterone replacement therapy- this was communicated to pt via MyChart message OCt 2019 Advised to f/u with his established Urology- pt communicated understanding/agreement    FOLLOW-UP:  Return in about 3 months (around 09/21/2018) for CPE.

## 2018-06-22 ENCOUNTER — Ambulatory Visit (INDEPENDENT_AMBULATORY_CARE_PROVIDER_SITE_OTHER): Payer: BLUE CROSS/BLUE SHIELD | Admitting: Adult Health

## 2018-06-22 ENCOUNTER — Encounter: Payer: Self-pay | Admitting: Adult Health

## 2018-06-22 VITALS — BP 158/82 | HR 75 | Temp 98.4°F | Ht 71.0 in | Wt 283.2 lb

## 2018-06-22 DIAGNOSIS — R7303 Prediabetes: Secondary | ICD-10-CM | POA: Insufficient documentation

## 2018-06-22 DIAGNOSIS — E291 Testicular hypofunction: Secondary | ICD-10-CM

## 2018-06-22 DIAGNOSIS — Z79899 Other long term (current) drug therapy: Secondary | ICD-10-CM | POA: Diagnosis not present

## 2018-06-22 DIAGNOSIS — Z0000 Encounter for general adult medical examination without abnormal findings: Secondary | ICD-10-CM

## 2018-06-22 HISTORY — DX: Prediabetes: R73.03

## 2018-06-22 NOTE — Patient Instructions (Addendum)
Diabetes Mellitus and Nutrition, Adult When you have diabetes (diabetes mellitus), it is very important to have healthy eating habits because your blood sugar (glucose) levels are greatly affected by what you eat and drink. Eating healthy foods in the appropriate amounts, at about the same times every day, can help you:  Control your blood glucose.  Lower your risk of heart disease.  Improve your blood pressure.  Reach or maintain a healthy weight. Every person with diabetes is different, and each person has different needs for a meal plan. Your health care provider may recommend that you work with a diet and nutrition specialist (dietitian) to make a meal plan that is best for you. Your meal plan may vary depending on factors such as:  The calories you need.  The medicines you take.  Your weight.  Your blood glucose, blood pressure, and cholesterol levels.  Your activity level.  Other health conditions you have, such as heart or kidney disease. How do carbohydrates affect me? Carbohydrates, also called carbs, affect your blood glucose level more than any other type of food. Eating carbs naturally raises the amount of glucose in your blood. Carb counting is a method for keeping track of how many carbs you eat. Counting carbs is important to keep your blood glucose at a healthy level, especially if you use insulin or take certain oral diabetes medicines. It is important to know how many carbs you can safely have in each meal. This is different for every person. Your dietitian can help you calculate how many carbs you should have at each meal and for each snack. Foods that contain carbs include:  Bread, cereal, rice, pasta, and crackers.  Potatoes and corn.  Peas, beans, and lentils.  Milk and yogurt.  Fruit and juice.  Desserts, such as cakes, cookies, ice cream, and candy. How does alcohol affect me? Alcohol can cause a sudden decrease in blood glucose (hypoglycemia),  especially if you use insulin or take certain oral diabetes medicines. Hypoglycemia can be a life-threatening condition. Symptoms of hypoglycemia (sleepiness, dizziness, and confusion) are similar to symptoms of having too much alcohol. If your health care provider says that alcohol is safe for you, follow these guidelines:  Limit alcohol intake to no more than 1 drink per day for nonpregnant women and 2 drinks per day for men. One drink equals 12 oz of beer, 5 oz of wine, or 1 oz of hard liquor.  Do not drink on an empty stomach.  Keep yourself hydrated with water, diet soda, or unsweetened iced tea.  Keep in mind that regular soda, juice, and other mixers may contain a lot of sugar and must be counted as carbs. What are tips for following this plan?  Reading food labels  Start by checking the serving size on the "Nutrition Facts" label of packaged foods and drinks. The amount of calories, carbs, fats, and other nutrients listed on the label is based on one serving of the item. Many items contain more than one serving per package.  Check the total grams (g) of carbs in one serving. You can calculate the number of servings of carbs in one serving by dividing the total carbs by 15. For example, if a food has 30 g of total carbs, it would be equal to 2 servings of carbs.  Check the number of grams (g) of saturated and trans fats in one serving. Choose foods that have low or no amount of these fats.  Check the number of   milligrams (mg) of salt (sodium) in one serving. Most people should limit total sodium intake to less than 2,300 mg per day.  Always check the nutrition information of foods labeled as "low-fat" or "nonfat". These foods may be higher in added sugar or refined carbs and should be avoided.  Talk to your dietitian to identify your daily goals for nutrients listed on the label. Shopping  Avoid buying canned, premade, or processed foods. These foods tend to be high in fat, sodium,  and added sugar.  Shop around the outside edge of the grocery store. This includes fresh fruits and vegetables, bulk grains, fresh meats, and fresh dairy. Cooking  Use low-heat cooking methods, such as baking, instead of high-heat cooking methods like deep frying.  Cook using healthy oils, such as olive, canola, or sunflower oil.  Avoid cooking with butter, cream, or high-fat meats. Meal planning  Eat meals and snacks regularly, preferably at the same times every day. Avoid going long periods of time without eating.  Eat foods high in fiber, such as fresh fruits, vegetables, beans, and whole grains. Talk to your dietitian about how many servings of carbs you can eat at each meal.  Eat 4-6 ounces (oz) of lean protein each day, such as lean meat, chicken, fish, eggs, or tofu. One oz of lean protein is equal to: ? 1 oz of meat, chicken, or fish. ? 1 egg. ?  cup of tofu.  Eat some foods each day that contain healthy fats, such as avocado, nuts, seeds, and fish. Lifestyle  Check your blood glucose regularly.  Exercise regularly as told by your health care provider. This may include: ? 150 minutes of moderate-intensity or vigorous-intensity exercise each week. This could be brisk walking, biking, or water aerobics. ? Stretching and doing strength exercises, such as yoga or weightlifting, at least 2 times a week.  Take medicines as told by your health care provider.  Do not use any products that contain nicotine or tobacco, such as cigarettes and e-cigarettes. If you need help quitting, ask your health care provider.  Work with a Social worker or diabetes educator to identify strategies to manage stress and any emotional and social challenges. Questions to ask a health care provider  Do I need to meet with a diabetes educator?  Do I need to meet with a dietitian?  What number can I call if I have questions?  When are the best times to check my blood glucose? Where to find more  information:  American Diabetes Association: diabetes.org  Academy of Nutrition and Dietetics: www.eatright.[employer] of Diabetes and Digestive and Kidney Diseases (NIH): [employer] Summary  A healthy meal plan will help you control your blood glucose and maintain a healthy lifestyle.  Working with a diet and nutrition specialist (dietitian) can help you make a meal plan that is best for you.  Keep in mind that carbohydrates (carbs) and alcohol have immediate effects on your blood glucose levels. It is important to count carbs and to use alcohol carefully. This information is not intended to replace advice given to you by your health care provider. Make sure you discuss any questions you have with your health care provider. Document Released: 03/07/2005 Document Revised: 01/08/2017 Document Reviewed: 07/15/2016 Elsevier Interactive Patient Education  2019 [employer].   Exercising to [employer] Exercise is structured, repetitive physical activity to improve fitness and health. Getting regular exercise is important for everyone. It is especially important if you are overweight. Being overweight increases  your risk of heart disease, stroke, diabetes, high blood pressure, and several types of cancer. Reducing your calorie intake and exercising can help you lose weight. Exercise is usually categorized as moderate or vigorous intensity. To lose weight, most people need to do a certain amount of moderate-intensity or vigorous-intensity exercise each week. Moderate-intensity exercise  Moderate-intensity exercise is any activity that gets you moving enough to burn at least three times more energy (calories) than if you were sitting. Examples of moderate exercise include:  Walking a mile in 15 minutes.  Doing light yard work.  Biking at an easy pace. Most people should get at least 150 minutes (2 hours and 30 minutes) a week of moderate-intensity exercise to maintain  their body weight. Vigorous-intensity exercise Vigorous-intensity exercise is any activity that gets you moving enough to burn at least six times more calories than if you were sitting. When you exercise at this intensity, you should be working hard enough that you are not able to carry on a conversation. Examples of vigorous exercise include:  Running.  Playing a team sport, such as football, basketball, and soccer.  Jumping rope. Most people should get at least 75 minutes (1 hour and 15 minutes) a week of vigorous-intensity exercise to maintain their body weight. How can exercise affect me? When you exercise enough to burn more calories than you eat, you lose weight. Exercise also reduces body fat and builds muscle. The more muscle you have, the more calories you burn. Exercise also:  Improves mood.  Reduces stress and tension.  Improves your overall fitness, flexibility, and endurance.  Increases bone strength. The amount of exercise you need to lose weight depends on:  Your age.  The type of exercise.  Any health conditions you have.  Your overall physical ability. Talk to your health care provider about how much exercise you need and what types of activities are safe for you. What actions can I take to lose weight? Nutrition   Make changes to your diet as told by your health care provider or diet and nutrition specialist (dietitian). This may include: ? Eating fewer calories. ? Eating more protein. ? Eating less unhealthy fats. ? Eating a diet that includes fresh fruits and vegetables, whole grains, low-fat dairy products, and lean protein. ? Avoiding foods with added fat, salt, and sugar.  Drink plenty of water while you exercise to prevent dehydration or heat stroke. Activity  Choose an activity that you enjoy and set realistic goals. Your health care provider can help you make an exercise plan that works for you.  Exercise at a moderate or vigorous intensity  most days of the week. ? The intensity of exercise may vary from person to person. You can tell how intense a workout is for you by paying attention to your breathing and heartbeat. Most people will notice their breathing and heartbeat get faster with more intense exercise.  Do resistance training twice each week, such as: ? Push-ups. ? Sit-ups. ? Lifting weights. ? Using resistance bands.  Getting short amounts of exercise can be just as helpful as long structured periods of exercise. If you have trouble finding time to exercise, try to include exercise in your daily routine. ? Get up, stretch, and walk around every 30 minutes throughout the day. ? Go for a walk during your lunch break. ? Park your car farther away from your destination. ? If you take public transportation, get off one stop early and walk the rest of the way. ?  Make phone calls while standing up and walking around. ? Take the stairs instead of elevators or escalators.  Wear comfortable clothes and shoes with good support.  Do not exercise so much that you hurt yourself, feel dizzy, or get very short of breath. Where to find more information  U.S. Department of Health and Human Services: [employer].at  Centers for Disease Control and Prevention (CDC): http://www.wolf.info/ Contact a health care provider:  Before starting a new exercise program.  If you have questions or concerns about your weight.  If you have a medical problem that keeps you from exercising. Get help right away if you have any of the following while exercising:  Injury.  Dizziness.  Difficulty breathing or shortness of breath that does not go away when you stop exercising.  Chest pain.  Rapid heartbeat. Summary  Being overweight increases your risk of heart disease, stroke, diabetes, high blood pressure, and several types of cancer.  Losing weight happens when you burn more calories than you eat.  Reducing the amount of calories you eat in  addition to getting regular moderate or vigorous exercise each week helps you lose weight. This information is not intended to replace advice given to you by your health care provider. Make sure you discuss any questions you have with your health care provider. Document Released: 07/13/2010 Document Revised: 06/23/2017 Document Reviewed: 06/23/2017 Elsevier Interactive Patient Education  2019 [place] all medications as directed. Continue to drink plenty of water and follow diabetic diet. Try to increase regular exercise- activities that will not aggravate knee pain. We will call you when lab results are available. If blood pressure is not at goal at follow-up (<130/80) then we will discuss adding an additional medicine- but you can help lower your pressure with weight loss and regular exercise.  Please schedule complete physical in 3 months. HAPPY NEW YEAR!

## 2018-06-22 NOTE — Assessment & Plan Note (Signed)
Due to elevated H/H will not continue testosterone replacement therapy- this was communicated to pt via MyChart message OCt 2019 Advised to f/u with his established Urology- pt communicated understanding/agreement

## 2018-06-22 NOTE — Assessment & Plan Note (Signed)
Lab Results  Component Value Date   HGBA1C 5.6 04/14/2018   HGBA1C 5.7 (H) 03/05/2017   HGBA1C 5.5 10/16/2015   Currently taking Metformin Declined referral to [place] referral to PT to help with mobility and weigh loss

## 2018-06-22 NOTE — Assessment & Plan Note (Addendum)
Continue all medications as directed. Continue to drink plenty of water and follow diabetic diet. Try to increase regular exercise- activities that will not aggravate knee pain. We will call you when lab results are available. If blood pressure is not at goal at follow-up (<130/80) then we will discuss adding an additional medicine- but you can help lower your pressure with weight loss and regular exercise.  Please schedule complete physical in 3 months.

## 2018-06-25 ENCOUNTER — Encounter: Payer: Self-pay | Admitting: Adult Health

## 2018-07-02 ENCOUNTER — Other Ambulatory Visit: Payer: BLUE CROSS/BLUE SHIELD

## 2018-07-02 DIAGNOSIS — Z79899 Other long term (current) drug therapy: Secondary | ICD-10-CM | POA: Diagnosis not present

## 2018-07-03 LAB — HEPATIC FUNCTION PANEL
ALT: 19 IU/L (ref 0–44)
AST: 31 IU/L (ref 0–40)
Albumin: 4.4 g/dL (ref 3.6–4.8)
Alkaline Phosphatase: 102 IU/L (ref 39–117)
BILIRUBIN, DIRECT: 0.22 mg/dL (ref 0.00–0.40)
Bilirubin Total: 1 mg/dL (ref 0.0–1.2)
TOTAL PROTEIN: 6.8 g/dL (ref 6.0–8.5)

## 2018-07-10 ENCOUNTER — Other Ambulatory Visit: Payer: Self-pay | Admitting: Adult Health

## 2018-07-28 DIAGNOSIS — E291 Testicular hypofunction: Secondary | ICD-10-CM | POA: Diagnosis not present

## 2018-07-28 DIAGNOSIS — N400 Enlarged prostate without lower urinary tract symptoms: Secondary | ICD-10-CM | POA: Diagnosis not present

## 2018-07-30 ENCOUNTER — Encounter: Payer: Self-pay | Admitting: Adult Health

## 2018-08-03 ENCOUNTER — Other Ambulatory Visit: Payer: Self-pay | Admitting: Adult Health

## 2018-08-03 MED ORDER — HYDROCHLOROTHIAZIDE 12.5 MG PO CAPS: 12.5000 mg | ORAL_CAPSULE | Freq: Every day | ORAL | 0 refills | Status: DC

## 2018-08-25 ENCOUNTER — Other Ambulatory Visit: Payer: Self-pay | Admitting: Adult Health

## 2018-09-20 DIAGNOSIS — L03211 Cellulitis of face: Secondary | ICD-10-CM | POA: Diagnosis not present

## 2018-09-29 ENCOUNTER — Encounter: Payer: BLUE CROSS/BLUE SHIELD | Admitting: Adult Health

## 2019-07-04 ENCOUNTER — Ambulatory Visit
Admission: EM | Admit: 2019-07-04 | Discharge: 2019-07-04 | Disposition: A | Discharge status: Home / Self Care | Payer: BC Managed Care – PPO

## 2019-07-04 ENCOUNTER — Other Ambulatory Visit: Payer: Self-pay

## 2019-07-04 DIAGNOSIS — H6122 Impacted cerumen, left ear: Secondary | ICD-10-CM

## 2019-07-04 DIAGNOSIS — I10 Essential (primary) hypertension: Secondary | ICD-10-CM

## 2019-07-04 NOTE — ED Provider Notes (Signed)
[facility]    CSN: [id number] Arrival date & time: 07/04/19  0945      History   Chief Complaint Chief Complaint  Patient presents with  . Otalgia    HPI Carlos [name] is a 64 y.o. male.   64 year old male comes in for left ear pain, swelling, muffled hearing. States has had this in the past with cerumen impaction. Denies URI symptoms such as cough, congestion, sore throat. Denies fever, chills, body aches. Has not tried anything for the symptoms.  Patient hypertensive at triage. Denies chest pain, shortness of breath, headache, one sided weakness, dizziness, syncope.      Past Medical History:  Diagnosis Date  . Diabetes mellitus without complication ([place])    pre-diabetic  . Hyperlipidemia   . Hypertension   . Low testosterone     Patient Active Problem List   Diagnosis Date Noted  . Pre-diabetes 06/22/2018  . Elevated PSA 06/04/2017  . Healthcare maintenance 03/05/2017  . 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 Nonallopathic lesion-rib cage 03/28/2015  . Nonallopathic lesion of cervical region 03/28/2015  . Nonallopathic lesion of thoracic region 03/28/2015  . Hypogonadism in male 09/15/2007  . Hypertension associated with diabetes ([place]) 09/15/2007  . HEMORRHOIDS, INTERNAL 09/15/2007  . EXTERNAL HEMORRHOIDS 09/15/2007  . DIVERTICULITIS OF COLON 09/15/2007    Past Surgical History:  Procedure Laterality Date  . COLONOSCOPY    . MENISCUS REPAIR    . ORIF TIBIA PLATEAU Left 11/09/2015   Procedure: OPEN REDUCTION INTERNAL (ORIF) FIXATION LEFT LATERAL TIBIAL PLATEAU;  Surgeon: Justice Britain, MD;  Location: [facility];  Service: Orthopedics;  Laterality: Left;  . ROTATOR CUFF REPAIR Right 2002  . VASECTOMY         Home Medications    Prior to Admission medications   Medication Sig Start Date End Date Taking? Authorizing Provider  testosterone enanthate (DELATESTRYL) 200 MG/ML injection Inject into the muscle once a week. For IM use only   Yes  [provider]  Ascorbic Acid (VITAMIN C) 1000 MG tablet Take 1,000 mg by mouth 3 (three) times daily.    [provider]  co-enzyme Q-10 30 MG capsule Take 30 mg by mouth daily. Tablet 4 caps 5 days a week     [provider]  ibuprofen (ADVIL,MOTRIN) 200 MG tablet Take 400 mg by mouth every 6 (six) hours as needed (For pain.).    [provider]  MAGNESIUM GLUCONATE PO Take by mouth. 5-6 capsules daily    [provider]  OVER THE COUNTER MEDICATION Take 3 tablets by mouth 2 (two) times daily. Metabolic Syngery    [provider]    Family History Family History  Problem Relation Age of Onset  . Cancer Mother        "male"  . Lung cancer Father   . Bladder Cancer Sister   . Colon cancer Neg Hx   . Colon polyps Neg Hx   . Esophageal cancer Neg Hx   . Rectal cancer Neg Hx   . Stomach cancer Neg Hx     Social History Social History   Tobacco Use  . Smoking status: Never Smoker  . Smokeless tobacco: Never Used  Substance Use Topics  . Alcohol use: Yes    Alcohol/week: 0.0 standard drinks    Comment: occasional/social  . Drug use: No     Allergies   Sulfa antibiotics   Review of Systems Review of Systems  Reason unable to  perform ROS: See HPI as above.     Physical Exam Triage Vital Signs ED Triage Vitals  Enc Vitals Group     BP 07/04/19 1121 (!) 201/140     Pulse Rate 07/04/19 1121 74     Resp 07/04/19 1121 16     Temp 07/04/19 1121 98.2 F (36.8 C)     Temp Source 07/04/19 1121 Oral     SpO2 07/04/19 1121 96 %     Weight --      Height --      Head Circumference --      Peak Flow --      Pain Score 07/04/19 1157 0     Pain Loc --      Pain Edu? --      Excl. in [place]? --    No data found.  Updated Vital Signs BP (!) 201/140 (BP Location: Left Arm) Comment: Hx of HTN, pt is currently not taking BP medicine  Pulse 74   Temp 98.2 F (36.8 C) (Oral)   Resp 16   SpO2 96%   Physical  Exam Constitutional:      General: He is not in acute distress.    Appearance: Normal appearance. He is well-developed. He is not toxic-appearing or diaphoretic.  HENT:     Head: Normocephalic and atraumatic.     Right Ear: External ear normal.     Left Ear: External ear normal.     Ears:     Comments: No tenderness to palpation of bilateral tragus.  Right cerumen to the 6 o'clock region of canal.  TM partially visible, without erythema or bulging.  Left cerumen impaction, TM not visible. Eyes:     Conjunctiva/sclera: Conjunctivae normal.     Pupils: Pupils are equal, round, and reactive to light.  Pulmonary:     Effort: Pulmonary effort is normal. No respiratory distress.     Comments: Speaking in full sentences without difficulty Musculoskeletal:     Cervical back: Normal range of motion and neck supple.  Skin:    General: Skin is warm and dry.  Neurological:     Mental Status: He is alert and oriented to person, place, and time.      UC Treatments / Results  Labs (all labs ordered are listed, but only abnormal results are displayed) Labs Reviewed - No data to display  EKG   Radiology No results found.  Procedures Procedures (including critical care time)  Medications Ordered in UC Medications - No data to display  Initial Impression / Assessment and Plan / UC Course  I have reviewed the triage vital signs and the nursing notes.  Pertinent labs & imaging results that were available during my care of the patient were reviewed by me and considered in my medical decision making (see chart for details).    Patient tolerated ear irrigation well. TM visible without otitis media. Discussed using flonase/nasacort if continues with symptoms. Return precautions given.   Final Clinical Impressions(s) / UC Diagnoses   Final diagnoses:  Impacted cerumen of left ear    ED Prescriptions    None     PDMP not reviewed this encounter.   Ok Edwards, PA-C 07/04/19  1338

## 2019-07-04 NOTE — Discharge Instructions (Signed)
Ear wax removed today. If continue to have ear fullness, can try over the counter flonase/nasacort for possible eustachian tube dysfunction causing symptoms. Follow up for reevaluation if symptoms not improving, having pain.  

## 2019-07-04 NOTE — ED Triage Notes (Signed)
Pt c/o lt ear clogged since this am.

## 2019-07-08 ENCOUNTER — Encounter: Payer: Self-pay | Admitting: Podiatry

## 2019-07-08 ENCOUNTER — Other Ambulatory Visit: Payer: Self-pay

## 2019-07-08 ENCOUNTER — Ambulatory Visit (INDEPENDENT_AMBULATORY_CARE_PROVIDER_SITE_OTHER): Payer: BC Managed Care – PPO | Admitting: Podiatry

## 2019-07-08 DIAGNOSIS — B353 Tinea pedis: Secondary | ICD-10-CM | POA: Diagnosis not present

## 2019-07-08 DIAGNOSIS — B351 Tinea unguium: Secondary | ICD-10-CM

## 2019-07-08 DIAGNOSIS — G629 Polyneuropathy, unspecified: Secondary | ICD-10-CM

## 2019-07-08 MED ORDER — CLOTRIMAZOLE-BETAMETHASONE 1-0.05 % EX CREA: 1.0000 "application " | TOPICAL_CREAM | Freq: Two times a day (BID) | CUTANEOUS | 0 refills | Status: DC

## 2019-07-12 ENCOUNTER — Encounter: Payer: Self-pay | Admitting: Internal Medicine

## 2019-07-14 ENCOUNTER — Telehealth: Payer: Self-pay | Admitting: Podiatry

## 2019-07-14 NOTE — Telephone Encounter (Signed)
Pt was seen in office last week and was supposed to have a prescription sent to [facility]. Pt has not heard anything from the pharmacy and is calling to follow up.

## 2019-07-15 ENCOUNTER — Telehealth: Payer: Self-pay

## 2019-07-15 ENCOUNTER — Telehealth: Payer: Self-pay | Admitting: *Deleted

## 2019-07-15 NOTE — Telephone Encounter (Signed)
Called and spoke with the patient and the patient has already gotten a hold of [facility] and the RX will be at the patient's home either tomorrow or Saturday. Lattie Haw

## 2019-07-15 NOTE — Progress Notes (Signed)
Subjective:   Patient ID: Carlos [name], male   DOB: 64 y.o.   MRN: [id number]   HPI 64 year old male presents the office today for concerns of thick, discolored toenails.  Denies any redness or drainage from the toenail sites.  He also states he gets numbness to the balls of his feet.  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  Also reports that he has a rash on the bottoms of his feet which has been ongoing for last 2 years.   Review of Systems  All other systems reviewed and are negative.   Past Medical History:  Diagnosis Date  . Hyperlipidemia   . Hypertension   . Low testosterone   . Prediabetes    pre-diabetic    Past Surgical History:  Procedure Laterality Date  . COLONOSCOPY    . MENISCUS REPAIR    . ORIF TIBIA PLATEAU Left 11/09/2015   Procedure: OPEN REDUCTION INTERNAL (ORIF) FIXATION LEFT LATERAL TIBIAL PLATEAU;  Surgeon: Justice Britain, MD;  Location: [facility];  Service: Orthopedics;  Laterality: Left;  . ROTATOR CUFF REPAIR Right 2002  . VASECTOMY       Current Outpatient Medications:  .  NON FORMULARY, [facility]  Antifungal (nail)-#1, Disp: , Rfl:  .  clotrimazole-betamethasone (LOTRISONE) cream, Apply 1 application topically 2 (two) times daily., Disp: 30 g, Rfl: 0  Allergies  Allergen Reactions  . Sulfa Antibiotics Other (See Comments)    Reaction unknown occurred during childhood        Objective:  Physical Exam  General: AAO x3, NAD  Dermatological: Nails appear to be hypertrophic, dystrophic with yellow-brown discoloration but there is no pain to the nails currently there is no redness or drainage or any signs of infection.  No open lesions.  Dry, scaly, erythematous rash in the bottom of the feet consistent with a mild tinea pedis.  Vascular: Dorsalis Pedis artery and Posterior Tibial artery pedal pulses are 2/4 bilateral with immedate capillary fill time.  There is  no pain with calf compression, swelling, warmth, erythema.   Neruologic: Sensation mildly decreased with Semmes Weinstein monofilament to the digits  Musculoskeletal: No gross boney pedal deformities bilateral. No pain, crepitus, or limitation noted with foot and ankle range of motion bilateral. Muscular strength 5/5 in all groups tested bilateral.  Gait: Unassisted, Nonantalgic.      Assessment:   64 year old male with onychomycosis, tinea pedis, possible early neuropathy     Plan:  -Treatment options discussed including all alternatives, risks, and complications -Etiology of symptoms were discussed -Regards the tones were discussed various treatment options.  I ordered a compound cream today through [facility] for nail fungus. -Lotrisone cream for the skin -Discussed treatment options for neuropathy and further evaluation.  He was to continue to monitor this for now and hold off any medications.  No follow-ups on file.  Trula Slade DPM

## 2019-07-15 NOTE — Telephone Encounter (Signed)

## 2019-07-15 NOTE — Telephone Encounter (Signed)
Can you let him know it was resent today

## 2019-07-16 ENCOUNTER — Ambulatory Visit (INDEPENDENT_AMBULATORY_CARE_PROVIDER_SITE_OTHER): Payer: BC Managed Care – PPO | Admitting: Internal Medicine

## 2019-07-16 ENCOUNTER — Other Ambulatory Visit: Payer: Self-pay

## 2019-07-16 ENCOUNTER — Encounter: Payer: Self-pay | Admitting: Internal Medicine

## 2019-07-16 VITALS — BP 190/110 | HR 85 | Temp 97.2°F | Resp 17 | Ht 70.0 in | Wt 273.0 lb

## 2019-07-16 DIAGNOSIS — R7303 Prediabetes: Secondary | ICD-10-CM

## 2019-07-16 DIAGNOSIS — Z1159 Encounter for screening for other viral diseases: Secondary | ICD-10-CM | POA: Diagnosis not present

## 2019-07-16 DIAGNOSIS — I10 Essential (primary) hypertension: Secondary | ICD-10-CM | POA: Diagnosis not present

## 2019-07-16 DIAGNOSIS — Z114 Encounter for screening for human immunodeficiency virus [HIV]: Secondary | ICD-10-CM

## 2019-07-16 DIAGNOSIS — Z7689 Persons encountering health services in other specified circumstances: Secondary | ICD-10-CM

## 2019-07-16 MED ORDER — HYDROCHLOROTHIAZIDE 25 MG PO TABS: 25.0000 mg | ORAL_TABLET | Freq: Every day | ORAL | 3 refills | Status: DC

## 2019-07-16 NOTE — Progress Notes (Signed)
  Subjective:    Carlos [name] - 64 y.o. male MRN [id number]  Date of birth: 11-Nov-1955  HPI  QUETIN WAGENBACH is to establish care. Patient has a PMH significant for HTN, pre-diabetes, hyperlipidemia. Elevated PSA and low testosterone is followed by urology.   Chronic HTN Disease Monitoring:  Home BP Monitoring - monitors with wrist cuff, was elevated to 123456 systolic  Chest pain- no  Dyspnea- no Headache - no  Medications: No medications currently. Was on medication previously but been out of meds for over a year. Was on HCTZ previously.  Compliance- no Lightheadedness- no  Edema- no   Social History   reports that he has never smoked. He has never used smokeless tobacco. He reports current alcohol use. He reports that he does not use drugs.   Family History  family history includes Bladder Cancer in his sister; Cancer in his mother; Lung cancer in his father.   Health Maintenance Due  Topic Date Due  . Hepatitis C Screening  08-May-1956  . HIV Screening  06/25/1969  . PNA vac Low Risk Adult (1 of 2 - PCV13) 06/26/2019      ROS per HPI    Past Medical History: Patient Active Problem List   Diagnosis Date Noted  . Pre-diabetes 06/22/2018  . Elevated PSA 06/04/2017  . Hypogonadism in male 09/15/2007  . Essential hypertension 09/15/2007  . DIVERTICULITIS OF COLON 09/15/2007    Medications: reviewed and updated   Objective:   Physical Exam BP (!) 190/110   Pulse 85   Temp (!) 97.2 F (36.2 C) (Temporal)   Resp 17   Ht 5\' 10"  (1.778 m)   Wt 273 lb (123.8 kg)   SpO2 98%   BMI 39.17 kg/m  Physical Exam  Constitutional: He is oriented to person, place, and time and well-developed, well-nourished, and in no distress. No distress.  HENT:  Head: Normocephalic and atraumatic.  Eyes: Conjunctivae and EOM are normal.  Cardiovascular: Normal rate, regular rhythm and normal heart sounds.  No murmur heard. Pulmonary/Chest: Effort normal and breath sounds  normal. No respiratory distress.  Musculoskeletal:        General: Normal range of motion.  Neurological: He is alert and oriented to person, place, and time.  Skin: Skin is warm and dry. He is not diaphoretic.  Psychiatric: Affect and judgment normal.        Assessment & Plan:   1. Encounter to establish care Declines PNA vaccine.   2. Pre-diabetes Last A1c 5.29 Mar 2018.  - Hemoglobin A1c  3. Essential hypertension BP elevated today at 190/110. Start HCTZ 25 mg. Return in two weeks for repeat BMET and BP check. Suspect may need an additional BP agent for optimal control.  Counseled on blood pressure goal of less than 130/80, low-sodium, DASH diet, medication compliance, 150 minutes of moderate intensity exercise per week. Discussed medication compliance, adverse effects. - Comprehensive metabolic panel - Lipid panel - hydrochlorothiazide (HYDRODIURIL) 25 MG tablet; Take 1 tablet (25 mg total) by mouth daily.  Dispense: 90 tablet; Refill: 3  4. Need for hepatitis C screening test - Hepatitis C antibody  5. Screening for HIV (human immunodeficiency virus) - HIV Antibody (routine testing w rflx)    Phill Myron, D.O. 07/16/2019, 8:45 AM Primary Care at [facility]

## 2019-07-16 NOTE — Patient Instructions (Signed)
Thank you for choosing Primary Care at [facility] to be your medical home!    Carlos [name] was seen by Melina Schools, DO today.   Carlos [name]'s primary care provider is Phill Myron, DO.   For the best care possible, you should try to see Phill Myron, DO whenever you come to the clinic.   We look forward to seeing you again soon!  If you have any questions about your visit today, please call us at [phone] or feel free to reach your primary care provider via [place].

## 2019-07-20 ENCOUNTER — Encounter: Payer: Self-pay | Admitting: Internal Medicine

## 2019-07-20 DIAGNOSIS — E785 Hyperlipidemia, unspecified: Secondary | ICD-10-CM | POA: Insufficient documentation

## 2019-07-20 LAB — COMPREHENSIVE METABOLIC PANEL
ALT: 19 IU/L (ref 0–44)
AST: 29 IU/L (ref 0–40)
Albumin/Globulin Ratio: 1.7 (ref 1.2–2.2)
Albumin: 4.7 g/dL (ref 3.8–4.8)
Alkaline Phosphatase: 108 IU/L (ref 39–117)
BUN/Creatinine Ratio: 14 (ref 10–24)
BUN: 16 mg/dL (ref 8–27)
Bilirubin Total: 1.1 mg/dL (ref 0.0–1.2)
CO2: 24 mmol/L (ref 20–29)
Calcium: 10.1 mg/dL (ref 8.6–10.2)
Chloride: 100 mmol/L (ref 96–106)
Creatinine, Ser: 1.17 mg/dL (ref 0.76–1.27)
GFR calc Af Amer: 75 mL/min/{1.73_m2} (ref >59)
GFR calc non Af Amer: 65 mL/min/{1.73_m2} (ref >59)
Globulin, Total: 2.7 g/dL (ref 1.5–4.5)
Glucose: 95 mg/dL (ref 65–99)
Potassium: 5.1 mmol/L (ref 3.5–5.2)
Sodium: 140 mmol/L (ref 134–144)
Total Protein: 7.4 g/dL (ref 6.0–8.5)

## 2019-07-20 LAB — LIPID PANEL
Chol/HDL Ratio: 7.4 ratio — ABNORMAL HIGH (ref 0.0–5.0)
Cholesterol, Total: 274 mg/dL — ABNORMAL HIGH (ref 100–199)
HDL: 37 mg/dL — ABNORMAL LOW (ref >39)
LDL Chol Calc (NIH): 211 mg/dL — ABNORMAL HIGH (ref 0–99)
Triglycerides: 140 mg/dL (ref 0–149)
VLDL Cholesterol Cal: 26 mg/dL (ref 5–40)

## 2019-07-20 LAB — HIV ANTIBODY (ROUTINE TESTING W REFLEX): HIV Screen 4th Generation wRfx: NONREACTIVE

## 2019-07-20 LAB — HEMOGLOBIN A1C
Est. average glucose Bld gHb Est-mCnc: 108 mg/dL
Hgb A1c MFr Bld: 5.4 % (ref 4.8–5.6)

## 2019-07-20 LAB — HEPATITIS C ANTIBODY: Hep C Virus Ab: <0.1 s/co ratio (ref 0.0–0.9)

## 2019-07-21 NOTE — Progress Notes (Signed)
Seen by patient Carlos [name] on 07/20/2019  5:48 PM EST

## 2019-07-22 ENCOUNTER — Telehealth: Payer: Self-pay | Admitting: *Deleted

## 2019-07-22 MED ORDER — NONFORMULARY OR COMPOUNDED ITEM: 5 refills | Status: DC

## 2019-07-22 NOTE — Telephone Encounter (Signed)
I spoke with [facility] and he states he will get the compound pharmacist - Gerald Stabs. I spoke with Gerald Stabs and requested the urea be removed from the refills of this antifungal for the pt.

## 2019-07-22 NOTE — Telephone Encounter (Signed)
Pt called and states the compound medication ordered from the compound pharmacy in [place] is watery and is fine on the toenails, but has made the surrounding skin burn and he is unable to wear shoes.

## 2019-07-22 NOTE — Telephone Encounter (Signed)
I spoke with pt and apologized for his discomfort, and explained that the medication that caused the burning sensation was urea, that help with the absorption of the antifungal medication in the toenails. I suggested he stop the use of the topical until the areas around the toes were no longer sensitive, and may want to put a light coating of neosporin around the toenails until no longer sensitive. I offered to reorder without the urea and pt states he would like to use up what he has in the $50.00 bottle and would only put on the affected toenails. I told pt when he began again to put vaseline around the toenails then apply the compound and allow to dry. I offered to have the urea removed from the follow up orders and pt agreed.

## 2019-07-30 ENCOUNTER — Other Ambulatory Visit: Payer: Self-pay

## 2019-07-30 ENCOUNTER — Ambulatory Visit (INDEPENDENT_AMBULATORY_CARE_PROVIDER_SITE_OTHER): Payer: BC Managed Care – PPO | Admitting: Internal Medicine

## 2019-07-30 ENCOUNTER — Encounter: Payer: Self-pay | Admitting: Internal Medicine

## 2019-07-30 VITALS — BP 180/120 | HR 75 | Temp 97.5°F | Resp 17 | Ht 70.0 in | Wt 267.0 lb

## 2019-07-30 DIAGNOSIS — I10 Essential (primary) hypertension: Secondary | ICD-10-CM

## 2019-07-30 DIAGNOSIS — E785 Hyperlipidemia, unspecified: Secondary | ICD-10-CM | POA: Diagnosis not present

## 2019-07-30 MED ORDER — ATORVASTATIN CALCIUM 80 MG PO TABS: 80.0000 mg | ORAL_TABLET | Freq: Every day | ORAL | 3 refills | Status: DC

## 2019-07-30 MED ORDER — ASPIRIN EC 81 MG PO TBEC: 81.0000 mg | DELAYED_RELEASE_TABLET | Freq: Every day | ORAL | Status: DC

## 2019-07-30 MED ORDER — AMLODIPINE BESYLATE 10 MG PO TABS: 10.0000 mg | ORAL_TABLET | Freq: Every day | ORAL | 3 refills | Status: DC

## 2019-07-30 NOTE — Patient Instructions (Signed)
Buy the enteric coated (EC) baby aspirin 81 mg.    DASH Eating Plan DASH stands for "Dietary Approaches to Stop Hypertension." The DASH eating plan is a healthy eating plan that has been shown to reduce high blood pressure (hypertension). It may also reduce your risk for type 2 diabetes, heart disease, and stroke. The DASH eating plan may also help with weight loss. What are tips for following this plan?  General guidelines  Avoid eating more than 2,300 mg (milligrams) of salt (sodium) a day. If you have hypertension, you may need to reduce your sodium intake to 1,500 mg a day.  Limit alcohol intake to no more than 1 drink a day for nonpregnant women and 2 drinks a day for men. One drink equals 12 oz of beer, 5 oz of wine, or 1 oz of hard liquor.  Work with your health care provider to maintain a healthy body weight or to lose weight. Ask what an ideal weight is for you.  Get at least 30 minutes of exercise that causes your heart to beat faster (aerobic exercise) most days of the week. Activities may include walking, swimming, or biking.  Work with your health care provider or diet and nutrition specialist (dietitian) to adjust your eating plan to your individual calorie needs. Reading food labels   Check food labels for the amount of sodium per serving. Choose foods with less than 5 percent of the Daily Value of sodium. Generally, foods with less than 300 mg of sodium per serving fit into this eating plan.  To find whole grains, look for the word "whole" as the first word in the ingredient list. Shopping  Buy products labeled as "low-sodium" or "no salt added."  Buy fresh foods. Avoid canned foods and premade or frozen meals. Cooking  Avoid adding salt when cooking. Use salt-free seasonings or herbs instead of table salt or sea salt. Check with your health care provider or pharmacist before using salt substitutes.  Do not fry foods. Cook foods using healthy methods such as baking,  boiling, grilling, and broiling instead.  Cook with heart-healthy oils, such as olive, canola, soybean, or sunflower oil. Meal planning  Eat a balanced diet that includes: ? 5 or more servings of fruits and vegetables each day. At each meal, try to fill half of your plate with fruits and vegetables. ? Up to 6-8 servings of whole grains each day. ? Less than 6 oz of lean meat, poultry, or fish each day. A 3-oz serving of meat is about the same size as a deck of cards. One egg equals 1 oz. ? 2 servings of low-fat dairy each day. ? A serving of nuts, seeds, or beans 5 times each week. ? Heart-healthy fats. Healthy fats called Omega-3 fatty acids are found in foods such as flaxseeds and coldwater fish, like sardines, salmon, and mackerel.  Limit how much you eat of the following: ? Canned or prepackaged foods. ? Food that is high in trans fat, such as fried foods. ? Food that is high in saturated fat, such as fatty meat. ? Sweets, desserts, sugary drinks, and other foods with added sugar. ? Full-fat dairy products.  Do not salt foods before eating.  Try to eat at least 2 vegetarian meals each week.  Eat more home-cooked food and less restaurant, buffet, and fast food.  When eating at a restaurant, ask that your food be prepared with less salt or no salt, if possible. What foods are recommended? The items  listed may not be a complete list. Talk with your dietitian about what dietary choices are best for you. Grains Whole-grain or whole-wheat bread. Whole-grain or whole-wheat pasta. Brown rice. [place]. Bulgur. Whole-grain and low-sodium cereals. Pita bread. Low-fat, low-sodium crackers. Whole-wheat flour tortillas. Vegetables Fresh or frozen vegetables (raw, steamed, roasted, or grilled). Low-sodium or reduced-sodium tomato and vegetable juice. Low-sodium or reduced-sodium tomato sauce and tomato paste. Low-sodium or reduced-sodium canned vegetables. Fruits All fresh, dried, or  frozen fruit. Canned fruit in natural juice (without added sugar). Meat and other protein foods Skinless chicken or [place]. Ground chicken or [place]. Pork with fat trimmed off. Fish and seafood. Egg whites. Dried beans, peas, or lentils. Unsalted nuts, nut butters, and seeds. Unsalted canned beans. Lean cuts of beef with fat trimmed off. Low-sodium, lean deli meat. Dairy Low-fat (1%) or fat-free (skim) milk. Fat-free, low-fat, or reduced-fat cheeses. Nonfat, low-sodium ricotta or cottage cheese. Low-fat or nonfat yogurt. Low-fat, low-sodium cheese. Fats and oils Soft margarine without trans fats. Vegetable oil. Low-fat, reduced-fat, or light mayonnaise and salad dressings (reduced-sodium). Canola, safflower, olive, soybean, and sunflower oils. Avocado. Seasoning and other foods Herbs. Spices. Seasoning mixes without salt. Unsalted popcorn and pretzels. Fat-free sweets. What foods are not recommended? The items listed may not be a complete list. Talk with your dietitian about what dietary choices are best for you. Grains Baked goods made with fat, such as croissants, muffins, or some breads. Dry pasta or rice meal packs. Vegetables Creamed or fried vegetables. Vegetables in a cheese sauce. Regular canned vegetables (not low-sodium or reduced-sodium). Regular canned tomato sauce and paste (not low-sodium or reduced-sodium). Regular tomato and vegetable juice (not low-sodium or reduced-sodium). Angie Fava. Olives. Fruits Canned fruit in a light or heavy syrup. Fried fruit. Fruit in cream or butter sauce. Meat and other protein foods Fatty cuts of meat. Ribs. Fried meat. Berniece Salines. Sausage. Bologna and other processed lunch meats. Salami. Fatback. Hotdogs. Bratwurst. Salted nuts and seeds. Canned beans with added salt. Canned or smoked fish. Whole eggs or egg yolks. Chicken or [place] with skin. Dairy Whole or 2% milk, cream, and half-and-half. Whole or full-fat cream cheese. Whole-fat or sweetened yogurt.  Full-fat cheese. Nondairy creamers. Whipped toppings. Processed cheese and cheese spreads. Fats and oils Butter. Stick margarine. Lard. Shortening. Ghee. Bacon fat. Tropical oils, such as coconut, palm kernel, or palm oil. Seasoning and other foods Salted popcorn and pretzels. Onion salt, garlic salt, seasoned salt, table salt, and sea salt. Worcestershire sauce. Tartar sauce. Barbecue sauce. Teriyaki sauce. Soy sauce, including reduced-sodium. Steak sauce. Canned and packaged gravies. Fish sauce. Oyster sauce. Cocktail sauce. Horseradish that you find on the shelf. Ketchup. Mustard. Meat flavorings and tenderizers. Bouillon cubes. Hot sauce and Tabasco sauce. Premade or packaged marinades. Premade or packaged taco seasonings. Relishes. Regular salad dressings. Where to find more information:  National Heart, Lung, and [place]: https://wilson-eaton.com/  American Heart Association: www.heart.org Summary  The DASH eating plan is a healthy eating plan that has been shown to reduce high blood pressure (hypertension). It may also reduce your risk for type 2 diabetes, heart disease, and stroke.  With the DASH eating plan, you should limit salt (sodium) intake to 2,300 mg a day. If you have hypertension, you may need to reduce your sodium intake to 1,500 mg a day.  When on the DASH eating plan, aim to eat more fresh fruits and vegetables, whole grains, lean proteins, low-fat dairy, and heart-healthy fats.  Work with your health care provider or  diet and nutrition specialist (dietitian) to adjust your eating plan to your individual calorie needs. This information is not intended to replace advice given to you by your health care provider. Make sure you discuss any questions you have with your health care provider. Document Revised: 05/23/2017 Document Reviewed: 06/03/2016 Elsevier Patient Education  2020 [employer].

## 2019-07-30 NOTE — Progress Notes (Signed)
  Subjective:    Carlos [name] - 64 y.o. male MRN [id number]  Date of birth: [birth year]  HPI  [name] is here for follow up of chronic medical conditions.  Chronic HTN Disease Monitoring:  Home BP Monitoring - Yes. 170-180s/100.  Chest pain- no  Dyspnea- no Headache - no  Medications: HCTZ 25 mg  Compliance- yes Lightheadedness- no  Edema- no       Health Maintenance:  There are no preventive care reminders to display for this patient.  -  reports that he has never smoked. He has never used smokeless tobacco. - Review of Systems: Per HPI. - Past Medical History: Patient Active Problem List   Diagnosis Date Noted  . Hyperlipidemia 07/20/2019  . Elevated PSA 06/04/2017  . Hypogonadism in male 09/15/2007  . Essential hypertension 09/15/2007  . DIVERTICULITIS OF COLON 09/15/2007   - Medications: reviewed and updated   Objective:   Physical Exam BP (!) 180/120   Pulse 75   Temp (!) 97.5 F (36.4 C) (Temporal)   Resp 17   Ht 5\' 10"  (1.778 m)   Wt 267 lb (121.1 kg)   SpO2 98%   BMI 38.31 kg/m  Physical Exam  Constitutional: He is oriented to person, place, and time and well-developed, well-nourished, and in no distress. No distress.  HENT:  Head: Normocephalic and atraumatic.  Eyes: Conjunctivae and EOM are normal.  Cardiovascular: Normal rate.  Pulmonary/Chest: Effort normal. No respiratory distress.  Musculoskeletal:        General: Normal range of motion.  Neurological: He is alert and oriented to person, place, and time.  Skin: Skin is warm and dry. He is not diaphoretic.  Psychiatric: Affect and judgment normal.           Assessment & Plan:   1. Essential hypertension BP remains elevated, not at goal. Need to add additional agent. Start Amlodipine, continue HCTZ. Counseled on potential for Amlodipine to cause LE edema. Continue to monitor BPs at home. Return in 1 month. Repeating BMET due to recent start of HCTZ.  Counseled  on blood pressure goal of less than 130/80, low-sodium, DASH diet, medication compliance, 150 minutes of moderate intensity exercise per week. Discussed medication compliance, adverse effects. - Basic metabolic panel - amLODipine (NORVASC) 10 MG tablet; Take 1 tablet (10 mg total) by mouth daily.  Dispense: 90 tablet; Refill: 3  2. Hyperlipidemia, unspecified hyperlipidemia type Discussed ASCVD risk of >60% calculated after recent lipid panel. Start statin and baby ASA therapy. Will need to repeat LDL with addition of statin in about 3 months as LDL was markedly elevated at 211.  - atorvastatin (LIPITOR) 80 MG tablet; Take 1 tablet (80 mg total) by mouth daily.  Dispense: 90 tablet; Refill: 3 - aspirin EC 81 MG tablet; Take 1 tablet (81 mg total) by mouth daily.  Dispense:       Phill Myron, D.O. 07/30/2019, 10:05 AM Primary Care at [facility]

## 2019-07-31 LAB — BASIC METABOLIC PANEL
BUN/Creatinine Ratio: 12 (ref 10–24)
BUN: 15 mg/dL (ref 8–27)
CO2: 25 mmol/L (ref 20–29)
Calcium: 10.6 mg/dL — ABNORMAL HIGH (ref 8.6–10.2)
Chloride: 97 mmol/L (ref 96–106)
Creatinine, Ser: 1.21 mg/dL (ref 0.76–1.27)
GFR calc Af Amer: 72 mL/min/{1.73_m2} (ref >59)
GFR calc non Af Amer: 62 mL/min/{1.73_m2} (ref >59)
Glucose: 86 mg/dL (ref 65–99)
Potassium: 4.8 mmol/L (ref 3.5–5.2)
Sodium: 140 mmol/L (ref 134–144)

## 2019-08-02 NOTE — Progress Notes (Signed)
Patient notified of results & recommendations. Expressed understanding.

## 2019-08-20 DIAGNOSIS — Z125 Encounter for screening for malignant neoplasm of prostate: Secondary | ICD-10-CM | POA: Diagnosis not present

## 2019-08-20 DIAGNOSIS — E291 Testicular hypofunction: Secondary | ICD-10-CM | POA: Diagnosis not present

## 2019-08-23 ENCOUNTER — Encounter: Payer: Self-pay | Admitting: General Practice

## 2019-08-25 DIAGNOSIS — Z125 Encounter for screening for malignant neoplasm of prostate: Secondary | ICD-10-CM | POA: Diagnosis not present

## 2019-08-25 DIAGNOSIS — D751 Secondary polycythemia: Secondary | ICD-10-CM | POA: Diagnosis not present

## 2019-08-25 DIAGNOSIS — E291 Testicular hypofunction: Secondary | ICD-10-CM | POA: Diagnosis not present

## 2019-09-03 ENCOUNTER — Ambulatory Visit (INDEPENDENT_AMBULATORY_CARE_PROVIDER_SITE_OTHER): Payer: BC Managed Care – PPO | Admitting: Internal Medicine

## 2019-09-03 DIAGNOSIS — I10 Essential (primary) hypertension: Secondary | ICD-10-CM | POA: Diagnosis not present

## 2019-09-03 DIAGNOSIS — N529 Male erectile dysfunction, unspecified: Secondary | ICD-10-CM | POA: Diagnosis not present

## 2019-09-03 MED ORDER — LOSARTAN POTASSIUM 50 MG PO TABS: 50.0000 mg | ORAL_TABLET | Freq: Every day | ORAL | 3 refills | Status: DC

## 2019-09-03 NOTE — Progress Notes (Signed)
Virtual Visit via Telephone Note  I connected with Carlos [name], on 09/03/2019 at 8:34 AM by telephone due to the COVID-19 pandemic and verified that I am speaking with the correct person using two identifiers.   Consent: I discussed the limitations, risks, security and privacy concerns of performing an evaluation and management service by telephone and the availability of in person appointments. I also discussed with the patient that there may be a patient responsible charge related to this service. The patient expressed understanding and agreed to proceed.   Location of Patient: Home   Location of Provider: Clinic    Persons participating in Telemedicine visit: Viaan Mayes [facility] Dr. Juleen China      History of Present Illness:  Chronic HTN Disease Monitoring:  Home BP Monitoring - 130-150s/80s at home, monitoring daily. Higher this AM but has not taken his medications.  Chest pain- no  Dyspnea- no Headache - no  Medications: HCTZ 25 mg, Amlodipine 10 mg  Compliance- yes Lightheadedness- no  Edema- no   Has noted ED since starting the Amlodipine.     Past Medical History:  Diagnosis Date  . Hyperlipidemia   . Hypertension   . Low testosterone   . Pre-diabetes 06/22/2018  . Prediabetes    pre-diabetic   Allergies  Allergen Reactions  . Sulfa Antibiotics Other (See Comments)    Reaction unknown occurred during childhood    Current Outpatient Medications on File Prior to Visit  Medication Sig Dispense Refill  . amLODipine (NORVASC) 10 MG tablet Take 1 tablet (10 mg total) by mouth daily. 90 tablet 3  . aspirin EC 81 MG tablet Take 1 tablet (81 mg total) by mouth daily.    Marland Kitchen atorvastatin (LIPITOR) 80 MG tablet Take 1 tablet (80 mg total) by mouth daily. 90 tablet 3  . clotrimazole-betamethasone (LOTRISONE) cream Apply 1 application topically 2 (two) times daily. 30 g 0  . hydrochlorothiazide (HYDRODIURIL) 25 MG tablet Take 1 tablet  (25 mg total) by mouth daily. 90 tablet 3  . NONFORMULARY OR COMPOUNDED ITEM [facility]:  Antifungal topical - Terbinafine 3%, Fluconazole 2%, Tea Tree oil 5%, Ibuprofen 2%, in 71ml DMSO suspension. Apply to the affected toenails daily. 30 each 5  . testosterone cypionate (DEPOTESTOSTERONE CYPIONATE) 200 MG/ML injection SMARTSIG:0.5 Milliliter(s) IM Once a Week     No current facility-administered medications on file prior to visit.    Observations/Objective: NAD. Speaking clearly.  Work of breathing normal.  Alert and oriented. Mood appropriate.   Assessment and Plan: 1. Essential hypertension BPs not always at goal but more optimally controlled with second agent. Given side effects, have discontinued Amlodipine and will start Losartan. Check BMET one week after starting new medication.  Counseled on blood pressure goal of less than 130/80, low-sodium, DASH diet, medication compliance, 150 minutes of moderate intensity exercise per week. Discussed medication compliance, adverse effects. - losartan (COZAAR) 50 MG tablet; Take 1 tablet (50 mg total) by mouth daily.  Dispense: 90 tablet; Refill: 3 - Basic metabolic panel; Future  2. Erectile dysfunction, unspecified erectile dysfunction type Attributes to Amlodipine, not a typical side effect and does not sound like patient is becoming hypotensive with BP medications which could cause ED. Have discontinued and will monitor if this subsequently resolves.    Follow Up Instructions: Lab visit in one week    I discussed the assessment and treatment plan with the patient. The patient was provided an opportunity to ask questions and all were answered.  The patient agreed with the plan and demonstrated an understanding of the instructions.   The patient was advised to call back or seek an in-person evaluation if the symptoms worsen or if the condition fails to improve as anticipated.     I provided 14 minutes total of  non-face-to-face time during this encounter including median intraservice time, reviewing previous notes, investigations, ordering medications, medical decision making, coordinating care and patient verbalized understanding at the end of the visit.    Phill Myron, D.O. Primary Care at [facility]  09/03/2019, 8:34 AM

## 2019-09-10 ENCOUNTER — Other Ambulatory Visit: Payer: Self-pay

## 2019-09-10 ENCOUNTER — Other Ambulatory Visit: Payer: BC Managed Care – PPO

## 2019-09-10 DIAGNOSIS — I10 Essential (primary) hypertension: Secondary | ICD-10-CM

## 2019-09-11 LAB — BASIC METABOLIC PANEL
BUN/Creatinine Ratio: 12 (ref 10–24)
BUN: 17 mg/dL (ref 8–27)
CO2: 28 mmol/L (ref 20–29)
Calcium: 10.1 mg/dL (ref 8.6–10.2)
Chloride: 98 mmol/L (ref 96–106)
Creatinine, Ser: 1.39 mg/dL — ABNORMAL HIGH (ref 0.76–1.27)
GFR calc Af Amer: 61 mL/min/{1.73_m2} (ref >59)
GFR calc non Af Amer: 53 mL/min/{1.73_m2} — ABNORMAL LOW (ref >59)
Glucose: 101 mg/dL — ABNORMAL HIGH (ref 65–99)
Potassium: 4.8 mmol/L (ref 3.5–5.2)
Sodium: 140 mmol/L (ref 134–144)

## 2019-09-15 NOTE — Progress Notes (Signed)
Patient notified of results & recommendations. Expressed understanding.

## 2020-08-25 DIAGNOSIS — E291 Testicular hypofunction: Secondary | ICD-10-CM | POA: Diagnosis not present

## 2020-09-01 ENCOUNTER — Encounter: Payer: Self-pay | Admitting: Gastroenterology

## 2020-09-01 DIAGNOSIS — N400 Enlarged prostate without lower urinary tract symptoms: Secondary | ICD-10-CM | POA: Diagnosis not present

## 2020-09-01 DIAGNOSIS — N5201 Erectile dysfunction due to arterial insufficiency: Secondary | ICD-10-CM | POA: Diagnosis not present

## 2020-09-01 DIAGNOSIS — E291 Testicular hypofunction: Secondary | ICD-10-CM | POA: Diagnosis not present

## 2020-09-01 DIAGNOSIS — Z125 Encounter for screening for malignant neoplasm of prostate: Secondary | ICD-10-CM | POA: Diagnosis not present

## 2020-11-14 NOTE — Progress Notes (Signed)
Patient ID: Carlos [name], male    DOB: 07/01/1955  MRN: [id number]  CC: Annual Physical Exam  Subjective: Carlos [name] is a 65 y.o. male who presents for annual physical exam.  His concerns today include:   Concern for bilateral feet and toes discoloration. Reports feet and toes turn purple. Denies pain, tenderness, warmth, and swelling of lower extremties. Denies shortness of breath and chest pain.  He does not smoke. Endorses tingling on bilateral feet mostly the big toes. Wearing good support shoes and has a pair with orthotics. Cannot recall when discoloration of feet and toes began thinks may be about 2 years ago. Bilateral feet have always been cold since childhood.   Has seen a Podiatrist recently for bilateral feet toenail fungus and receiving treatment. Reports Podiatrist did not have any recommendation on discoloration.   Reports he is trying to become more active. Has been working from home for the past 2 years.   Blood pressure mildly elevated today. Reports he does have a bit of white coat hypertension. Reports home blood pressures are usually 130's/80's.  Patient Active Problem List   Diagnosis Date Noted  . Hyperlipidemia 07/20/2019  . Elevated PSA 06/04/2017  . Hypogonadism in male 09/15/2007  . Essential hypertension 09/15/2007  . DIVERTICULITIS OF COLON 09/15/2007     Current Outpatient Medications on File Prior to Visit  Medication Sig Dispense Refill  . aspirin EC 81 MG tablet Take 1 tablet (81 mg total) by mouth daily.    Marland Kitchen atorvastatin (LIPITOR) 80 MG tablet Take 1 tablet (80 mg total) by mouth daily. 90 tablet 3  . hydrochlorothiazide (HYDRODIURIL) 25 MG tablet Take 1 tablet (25 mg total) by mouth daily. 90 tablet 3  . losartan (COZAAR) 50 MG tablet Take 1 tablet (50 mg total) by mouth daily. 90 tablet 3  . testosterone cypionate (DEPOTESTOSTERONE CYPIONATE) 200 MG/ML injection SMARTSIG:0.5 Milliliter(s) IM Once a Week     No current facility-administered  medications on file prior to visit.    Allergies  Allergen Reactions  . Sulfa Antibiotics Other (See Comments)    Reaction unknown occurred during childhood    Social History   Socioeconomic History  . Marital status: Married    Spouse name: Not on file  . Number of children: Not on file  . Years of education: Not on file  . Highest education level: Not on file  Occupational History  . Not on file  Tobacco Use  . Smoking status: Never Smoker  . Smokeless tobacco: Never Used  Vaping Use  . Vaping Use: Never used  Substance and Sexual Activity  . Alcohol use: Yes    Alcohol/week: 0.0 standard drinks    Comment: occasional/social  . Drug use: No  . Sexual activity: Yes    Birth control/protection: Surgical  Other Topics Concern  . Not on file  Social History Narrative  . Not on file   Social Determinants of Health   Financial Resource Strain: Not on file  Food Insecurity: Not on file  Transportation Needs: Not on file  Physical Activity: Not on file  Stress: Not on file  Social Connections: Not on file  Intimate Partner Violence: Not on file    Family History  Problem Relation Age of Onset  . Cancer Mother        "male"  . Lung cancer Father   . Bladder Cancer Sister   . Colon cancer Neg Hx   . Colon polyps Neg Hx   .  Esophageal cancer Neg Hx   . Rectal cancer Neg Hx   . Stomach cancer Neg Hx     Past Surgical History:  Procedure Laterality Date  . COLONOSCOPY    . MENISCUS REPAIR    . ORIF TIBIA PLATEAU Left 11/09/2015   Procedure: OPEN REDUCTION INTERNAL (ORIF) FIXATION LEFT LATERAL TIBIAL PLATEAU;  Surgeon: Justice Britain, MD;  Location: [place];  Service: Orthopedics;  Laterality: Left;  . ROTATOR CUFF REPAIR Right 2002  . VASECTOMY      ROS: Review of Systems Negative except as stated above  PHYSICAL EXAM: BP (!) 139/95 (BP Location: Left Arm, Patient Position: Sitting, Cuff Size: Normal)   Pulse 70   Temp 98.6 F (37 C)   Resp 16   Ht 5'  11.26" (1.81 m)   Wt 267 lb 9.6 oz (121.4 kg)   SpO2 98%   BMI 37.05 kg/m   Physical Exam HENT:     Head: Normocephalic and atraumatic.     Right Ear: Tympanic membrane, ear canal and external ear normal.     Left Ear: Tympanic membrane, ear canal and external ear normal.  Eyes:     Extraocular Movements: Extraocular movements intact.     Conjunctiva/sclera: Conjunctivae normal.     Pupils: Pupils are equal, round, and reactive to light.  Cardiovascular:     Rate and Rhythm: Normal rate and regular rhythm.     Pulses: Normal pulses.     Heart sounds: Normal heart sounds.  Pulmonary:     Effort: Pulmonary effort is normal.     Breath sounds: Normal breath sounds.  Abdominal:     General: Bowel sounds are normal.     Palpations: Abdomen is soft.  Genitourinary:    Comments: Patient declined examination. Musculoskeletal:        General: Normal range of motion.     Cervical back: Normal range of motion and neck supple.     Comments: Bilateral toes with purple undertones. No evidence of bilateral lower extremity edema or tenderness. Bilateral lower extremities with mild erythema.  Skin:    General: Skin is warm and dry.     Capillary Refill: Capillary refill takes less than 2 seconds.  Neurological:     General: No focal deficit present.     Mental Status: He is alert and oriented to person, place, and time.  Psychiatric:        Mood and Affect: Mood normal.        Behavior: Behavior normal.     ASSESSMENT AND PLAN: 1. Annual physical exam: - Patient presents today to establish care.  - Return for annual physical examination, labs, and health maintenance. Arrive fasting meaning having no food for at least 8 hours prior to appointment. You may have only water or black coffee. Please take scheduled medications as normal.  2. Screening for metabolic disorder: - CMP to check kidney function, liver function, and electrolyte balance.  - Comprehensive metabolic panel  3.  Screening for deficiency anemia: - CBC to screen for anemia. - CBC  4. Diabetes mellitus screening: - Hemoglobin A1c to screen for pre-diabetes/diabetes. - Hemoglobin A1c  5. Thyroid disorder screen: - TSH to check thyroid function.  - TSH  6. Hyperlipidemia, unspecified hyperlipidemia type: - Lipid panel to screen for high cholesterol.  - Lipid panel  7. Colon cancer screening: - Referral to Gastroenterology for colon cancer screening by colonoscopy. - Ambulatory referral to Gastroenterology  8. Purple toe syndrome of both feet (  [facility]): - Patient concern for bilateral feet and toes discoloration. Reports feet and toes turn purple. Denies pain, tenderness, warmth, and swelling of lower extremties. Denies shortness of breath and chest pain.  He does not smoke. Endorses tingling on bilateral feet mostly the big toes. Wearing good support shoes and has a pair with orthotics. Cannot recall when discoloration of feet and toes began thinks may be about 2 years ago. Bilateral feet are baseline cold since childhood.  - Patient has seen a Podiatrist recently for bilateral feet toenail fungus and receiving treatment. Reports Podiatrist did not have any recommendation on discoloration.  - Patient reports he is trying to become more active. Has been working from home for the past 2 years.  - Referral to Vascular Surgery for further evaluation and management.  - Ambulatory referral to Vascular Surgery   Patient was given the opportunity to ask questions.  Patient verbalized understanding of the plan and was able to repeat key elements of the plan. Patient was given clear instructions to go to Emergency Department or return to medical center if symptoms don't improve, worsen, or new problems develop.The patient verbalized understanding.   Orders Placed This Encounter  Procedures  . CBC  . Comprehensive metabolic panel  . Lipid panel  . TSH  . Hemoglobin A1c  . Ambulatory referral to  Gastroenterology    Follow-up with primary provider as scheduled.   Camillia Herter, NP

## 2020-11-15 ENCOUNTER — Other Ambulatory Visit: Payer: Self-pay

## 2020-11-15 ENCOUNTER — Encounter: Payer: Self-pay | Admitting: Family

## 2020-11-15 ENCOUNTER — Ambulatory Visit (INDEPENDENT_AMBULATORY_CARE_PROVIDER_SITE_OTHER): Payer: BC Managed Care – PPO | Admitting: Family

## 2020-11-15 VITALS — BP 139/95 | HR 70 | Temp 98.6°F | Resp 16 | Ht 71.26 in | Wt 267.6 lb

## 2020-11-15 DIAGNOSIS — Z13228 Encounter for screening for other metabolic disorders: Secondary | ICD-10-CM

## 2020-11-15 DIAGNOSIS — Z1329 Encounter for screening for other suspected endocrine disorder: Secondary | ICD-10-CM | POA: Diagnosis not present

## 2020-11-15 DIAGNOSIS — Z1211 Encounter for screening for malignant neoplasm of colon: Secondary | ICD-10-CM

## 2020-11-15 DIAGNOSIS — Z130 Encounter for screening for diseases of the blood and blood-forming organs and certain disorders involving the immune mechanism: Secondary | ICD-10-CM | POA: Diagnosis not present

## 2020-11-15 DIAGNOSIS — I75023 Atheroembolism of bilateral lower extremities: Secondary | ICD-10-CM | POA: Diagnosis not present

## 2020-11-15 DIAGNOSIS — Z0001 Encounter for general adult medical examination with abnormal findings: Secondary | ICD-10-CM | POA: Diagnosis not present

## 2020-11-15 DIAGNOSIS — E785 Hyperlipidemia, unspecified: Secondary | ICD-10-CM | POA: Diagnosis not present

## 2020-11-15 DIAGNOSIS — Z0000 Encounter for general adult medical examination without abnormal findings: Secondary | ICD-10-CM

## 2020-11-15 DIAGNOSIS — Z131 Encounter for screening for diabetes mellitus: Secondary | ICD-10-CM | POA: Diagnosis not present

## 2020-11-15 NOTE — Patient Instructions (Signed)
Preventive Care 65 Years and Older, Male Preventive care refers to lifestyle choices and visits with your health care provider that can promote health and wellness. This includes:  A yearly physical exam. This is also called an annual wellness visit.  Regular dental and eye exams.  Immunizations.  Screening for certain conditions.  Healthy lifestyle choices, such as: ? Eating a healthy diet. ? Getting regular exercise. ? Not using drugs or products that contain nicotine and tobacco. ? Limiting alcohol use. What can I expect for my preventive care visit? Physical exam Your health care provider will check your:  Height and weight. These may be used to calculate your BMI (body mass index). BMI is a measurement that tells if you are at a healthy weight.  Heart rate and blood pressure.  Body temperature.  Skin for abnormal spots. Counseling Your health care provider may ask you questions about your:  Past medical problems.  Family's medical history.  Alcohol, tobacco, and drug use.  Emotional well-being.  Home life and relationship well-being.  Sexual activity.  Diet, exercise, and sleep habits.  History of falls.  Memory and ability to understand (cognition).  Work and work environment.  Access to firearms. What immunizations do I need? Vaccines are usually given at various ages, according to a schedule. Your health care provider will recommend vaccines for you based on your age, medical history, and lifestyle or other factors, such as travel or where you work.   What tests do I need? Blood tests  Lipid and cholesterol levels. These may be checked every 5 years, or more often depending on your overall health.  Hepatitis C test.  Hepatitis B test. Screening  Lung cancer screening. You may have this screening every year starting at age 55 if you have a 30-pack-year history of smoking and currently smoke or have quit within the past 15 years.  Colorectal  cancer screening. ? All adults should have this screening starting at age 50 and continuing until age 75. ? Your health care provider may recommend screening at age 45 if you are at increased risk. ? You will have tests every 1-10 years, depending on your results and the type of screening test.  Prostate cancer screening. Recommendations will vary depending on your family history and other risks.  Genital exam to check for testicular cancer or hernias.  Diabetes screening. ? This is done by checking your blood sugar (glucose) after you have not eaten for a while (fasting). ? You may have this done every 1-3 years.  Abdominal aortic aneurysm (AAA) screening. You may need this if you are a current or former smoker.  STD (sexually transmitted disease) testing, if you are at risk. Follow these instructions at home: Eating and drinking  Eat a diet that includes fresh fruits and vegetables, whole grains, lean protein, and low-fat dairy products. Limit your intake of foods with high amounts of sugar, saturated fats, and salt.  Take vitamin and mineral supplements as recommended by your health care provider.  Do not drink alcohol if your health care provider tells you not to drink.  If you drink alcohol: ? Limit how much you have to 0-2 drinks a day. ? Be aware of how much alcohol is in your drink. In the [place] one drink equals one 12 oz bottle of beer (355 mL), one 5 oz glass of wine (148 mL), or one 1 oz glass of hard liquor (44 mL).   Lifestyle  Take daily care of your teeth   and gums. Brush your teeth every morning and night with fluoride toothpaste. Floss one time each day.  Stay active. Exercise for at least 30 minutes 5 or more days each week.  Do not use any products that contain nicotine or tobacco, such as cigarettes, e-cigarettes, and chewing tobacco. If you need help quitting, ask your health care provider.  Do not use drugs.  If you are sexually active, practice safe sex.  Use a condom or other form of protection to prevent STIs (sexually transmitted infections).  Talk with your health care provider about taking a low-dose aspirin or statin.  Find healthy ways to cope with stress, such as: ? Meditation, yoga, or listening to music. ? Journaling. ? Talking to a trusted person. ? Spending time with friends and family. Safety  Always wear your seat belt while driving or riding in a vehicle.  Do not drive: ? If you have been drinking alcohol. Do not ride with someone who has been drinking. ? When you are tired or distracted. ? While texting.  Wear a helmet and other protective equipment during sports activities.  If you have firearms in your house, make sure you follow all gun safety procedures. What's next?  Visit your health care provider once a year for an annual wellness visit.  Ask your health care provider how often you should have your eyes and teeth checked.  Stay up to date on all vaccines. This information is not intended to replace advice given to you by your health care provider. Make sure you discuss any questions you have with your health care provider. Document Revised: 03/09/2019 Document Reviewed: 06/04/2018 Elsevier Patient Education  2021 Elsevier Inc.  

## 2020-11-15 NOTE — Progress Notes (Signed)
Annual physical exam

## 2020-11-16 ENCOUNTER — Other Ambulatory Visit: Payer: Self-pay | Admitting: Family

## 2020-11-16 DIAGNOSIS — R7989 Other specified abnormal findings of blood chemistry: Secondary | ICD-10-CM

## 2020-11-16 LAB — COMPREHENSIVE METABOLIC PANEL
ALT: 26 IU/L (ref 0–44)
AST: 38 IU/L (ref 0–40)
Albumin/Globulin Ratio: 1.7 (ref 1.2–2.2)
Albumin: 4.7 g/dL (ref 3.8–4.8)
Alkaline Phosphatase: 115 IU/L (ref 44–121)
BUN/Creatinine Ratio: 12 (ref 10–24)
BUN: 14 mg/dL (ref 8–27)
Bilirubin Total: 0.6 mg/dL (ref 0.0–1.2)
CO2: 24 mmol/L (ref 20–29)
Calcium: 10.1 mg/dL (ref 8.6–10.2)
Chloride: 96 mmol/L (ref 96–106)
Creatinine, Ser: 1.14 mg/dL (ref 0.76–1.27)
Globulin, Total: 2.7 g/dL (ref 1.5–4.5)
Glucose: 74 mg/dL (ref 65–99)
Potassium: 4.9 mmol/L (ref 3.5–5.2)
Sodium: 139 mmol/L (ref 134–144)
Total Protein: 7.4 g/dL (ref 6.0–8.5)
eGFR: 71 mL/min/{1.73_m2} (ref >59)

## 2020-11-16 LAB — CBC
Hematocrit: 55.2 % — ABNORMAL HIGH (ref 37.5–51.0)
Hemoglobin: 18.4 g/dL — ABNORMAL HIGH (ref 13.0–17.7)
MCH: 32 pg (ref 26.6–33.0)
MCHC: 33.3 g/dL (ref 31.5–35.7)
MCV: 96 fL (ref 79–97)
Platelets: 222 10*3/uL (ref 150–450)
RBC: 5.75 x10E6/uL (ref 4.14–5.80)
RDW: 13.1 % (ref 11.6–15.4)
WBC: 7.4 10*3/uL (ref 3.4–10.8)

## 2020-11-16 LAB — LIPID PANEL
Chol/HDL Ratio: 3.7 ratio (ref 0.0–5.0)
Cholesterol, Total: 141 mg/dL (ref 100–199)
HDL: 38 mg/dL — ABNORMAL LOW (ref >39)
LDL Chol Calc (NIH): 88 mg/dL (ref 0–99)
Triglycerides: 78 mg/dL (ref 0–149)
VLDL Cholesterol Cal: 15 mg/dL (ref 5–40)

## 2020-11-16 LAB — HEMOGLOBIN A1C
Est. average glucose Bld gHb Est-mCnc: 111 mg/dL
Hgb A1c MFr Bld: 5.5 % (ref 4.8–5.6)

## 2020-11-16 LAB — TSH: TSH: 1.24 u[IU]/mL (ref 0.450–4.500)

## 2020-11-16 NOTE — Progress Notes (Signed)
Kidney function normal.   Liver function normal.   Thyroid function normal.   No diabetes.  Hemoglobin and Hematocrit consistently elevated for 3 years. Referral to Hematology for further evaluation and management. Their office should call patient within 2 weeks with appointment details.   Cholesterol higher than expected. High cholesterol may increase risk of heart attack and/or stroke. Consider eating more fruits, vegetables, and lean baked meats such as chicken or fish. Moderate intensity exercise at least 150 minutes as tolerated per week may help as well.   Continue Atorvastatin and baby Aspirin for high cholesterol.  The following is for provider reference only: The 10-year ASCVD risk score Mikey Bussing DC Brooke Bonito., et al., 2013) is: 30%   Values used to calculate the score:     Age: 65 years     Sex: Male     Is Non-Hispanic African American: No     Diabetic: Yes     Tobacco smoker: No     Systolic Blood Pressure: 341 mmHg     Is BP treated: Yes     HDL Cholesterol: 38 mg/dL     Total Cholesterol: 141 mg/dL

## 2020-11-20 ENCOUNTER — Other Ambulatory Visit: Payer: Self-pay | Admitting: Internal Medicine

## 2020-11-20 DIAGNOSIS — I10 Essential (primary) hypertension: Secondary | ICD-10-CM

## 2020-11-20 DIAGNOSIS — E785 Hyperlipidemia, unspecified: Secondary | ICD-10-CM

## 2020-11-24 ENCOUNTER — Telehealth: Payer: Self-pay | Admitting: Physician Assistant

## 2020-11-24 NOTE — Telephone Encounter (Signed)
Received a new hem referral from Durene Fruits, NP for abnormal cbc. Carlos [name] has been cld and scheduled to see Murray Hodgkins on 6/10 at 2pm. Pt aware to arrive 20 minutes early.

## 2020-11-30 ENCOUNTER — Telehealth: Payer: Self-pay | Admitting: Hematology and Oncology

## 2020-11-30 ENCOUNTER — Other Ambulatory Visit: Payer: Self-pay

## 2020-11-30 DIAGNOSIS — I75029 Atheroembolism of unspecified lower extremity: Secondary | ICD-10-CM

## 2020-11-30 NOTE — Telephone Encounter (Signed)
Moved 6/10 appt to later time and changed provider due to original provider being out sick. Called and spoke with patient. Confirmed changes

## 2020-12-01 ENCOUNTER — Encounter: Payer: BC Managed Care – PPO | Admitting: Physician Assistant

## 2020-12-01 ENCOUNTER — Inpatient Hospital Stay: Payer: BC Managed Care – PPO | Attending: Physician Assistant | Admitting: Hematology and Oncology

## 2020-12-01 ENCOUNTER — Inpatient Hospital Stay: Payer: BC Managed Care – PPO

## 2020-12-01 ENCOUNTER — Other Ambulatory Visit: Payer: BC Managed Care – PPO

## 2020-12-01 ENCOUNTER — Other Ambulatory Visit: Payer: Self-pay

## 2020-12-01 VITALS — BP 148/108 | HR 72 | Temp 98.1°F | Resp 17 | Wt 270.4 lb

## 2020-12-01 DIAGNOSIS — I10 Essential (primary) hypertension: Secondary | ICD-10-CM | POA: Diagnosis not present

## 2020-12-01 DIAGNOSIS — D751 Secondary polycythemia: Secondary | ICD-10-CM

## 2020-12-01 DIAGNOSIS — E785 Hyperlipidemia, unspecified: Secondary | ICD-10-CM | POA: Diagnosis not present

## 2020-12-01 NOTE — Progress Notes (Signed)
[place] [phone]   Fax:([phone]) [place] NOTE  Patient Care Team: Nicolette Bang, DO as PCP - General (Family Medicine)  Hematological/Oncological History # Polycythemia 04/18/2016: WBC 7.8, Hgb 16.3, Plt 205 03/05/2017: WBC 6.1, Hgb 17.8, MCV 89, Plt 236 04/14/2018: WBC 6.5, Hgb 18.0, MCV 90, Plt 203 11/15/2020: WBC 7.4, Hgb 18.4, MCV 55.2, Plt 222 12/01/2020: establish care with Dr. Lorenso Courier   CHIEF COMPLAINTS/PURPOSE OF CONSULTATION:  "Polycythemia "  HISTORY OF PRESENTING ILLNESS:  Carlos [name] 65 y.o. male with medical history significant for HLD, HTN, pre DM type II, and low testosterone who presents for evaluation of polycythemia.   On review of the previous records Carlos [name] has had thrombocytosis since at least 03/05/2017.  At that time he had hemoglobin of 17.8 with an hematocrit of 51.2.  On 04/14/2018 the patient was found to have hemoglobin of 18.0 with a hematocrit of 54.5.  Most recently on 11/15/2020 the patient is found to have a hemoglobin of 18.4 with a hematocrit of 55.2.  Due to concern for this patient's longstanding polycythemia he was referred to hematology for further evaluation management.   On exam today Carlos [name] notes that he has been aware of his elevated hemoglobin "for years".  He notes that he typically donates blood and has been told he has a high hemoglobin.  There was a point in time where his hemoglobin was so high they almost were not able to accept his blood.  He is also been undergoing testosterone replacement for the last 10 years.  He reports that over the last several years he has had no new health issues and no new medications.  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  On further discussion he is a never smoker and has no signs or symptoms concerning for sleep apnea.  His wife reports that he "rarely snores".  He notes that he usually gives blood about  every 3 to 4 months and over his lifetime is given "15 gallons".  He notes he is not having any issues with shortness of breath or itching when getting out of the shower.  He drinks about 4 ounces of whiskey per week.  His family history is remarkable for ovarian cancer in his mom, lung cancer in his dad, and bladder cancer in his sister.  He currently denies any fevers, chills, sweats, nausea, vomiting or diarrhea.  A full 10 point ROS is listed below.  MEDICAL HISTORY:  Past Medical History:  Diagnosis Date   Hyperlipidemia    Hypertension    Low testosterone    Pre-diabetes 06/22/2018   Prediabetes    pre-diabetic    SURGICAL HISTORY: Past Surgical History:  Procedure Laterality Date   COLONOSCOPY     MENISCUS REPAIR     ORIF TIBIA PLATEAU Left 11/09/2015   Procedure: OPEN REDUCTION INTERNAL (ORIF) FIXATION LEFT LATERAL TIBIAL PLATEAU;  Surgeon: Justice Britain, MD;  Location: [facility];  Service: Orthopedics;  Laterality: Left;   ROTATOR CUFF REPAIR Right 2002   VASECTOMY      SOCIAL HISTORY: Social History   Socioeconomic History   Marital status: Married    Spouse name: Not on file   Number of children: Not on file   Years of education: Not on file   Highest education level: Not on file  Occupational History   Not on file  Tobacco Use   Smoking status: Never  Smokeless tobacco: Never  Vaping Use   Vaping Use: Never used  Substance and Sexual Activity   Alcohol use: Yes    Alcohol/week: 0.0 standard drinks    Comment: occasional/social   Drug use: No   Sexual activity: Yes    Birth control/protection: Surgical  Other Topics Concern   Not on file  Social History Narrative   Not on file   Social Determinants of Health   Financial Resource Strain: Not on file  Food Insecurity: Not on file  Transportation Needs: Not on file  Physical Activity: Not on file  Stress: Not on file  Social Connections: Not on file  Intimate Partner Violence: Not on file    FAMILY  HISTORY: Family History  Problem Relation Age of Onset   Cancer Mother        "male"   Lung cancer Father    Bladder Cancer Sister    Colon cancer Neg Hx    Colon polyps Neg Hx    Esophageal cancer Neg Hx    Rectal cancer Neg Hx    Stomach cancer Neg Hx     ALLERGIES:  is allergic to sulfa antibiotics.  MEDICATIONS:  Current Outpatient Medications  Medication Sig Dispense Refill   aspirin EC 81 MG tablet Take 1 tablet (81 mg total) by mouth daily.     atorvastatin (LIPITOR) 80 MG tablet TAKE 1 TABLET BY MOUTH DAILY 90 tablet 1   hydrochlorothiazide (HYDRODIURIL) 25 MG tablet TAKE 1 TABLET BY MOUTH DAILY 90 tablet 1   losartan (COZAAR) 50 MG tablet Take 1 tablet (50 mg total) by mouth daily. 90 tablet 3   testosterone cypionate (DEPOTESTOSTERONE CYPIONATE) 200 MG/ML injection SMARTSIG:0.5 Milliliter(s) IM Once a Week     No current facility-administered medications for this visit.    REVIEW OF SYSTEMS:   Constitutional: ( - ) fevers, ( - )  chills , ( - ) night sweats Eyes: ( - ) blurriness of vision, ( - ) double vision, ( - ) watery eyes Ears, nose, mouth, throat, and face: ( - ) mucositis, ( - ) sore throat Respiratory: ( - ) cough, ( - ) dyspnea, ( - ) wheezes Cardiovascular: ( - ) palpitation, ( - ) chest discomfort, ( - ) lower extremity swelling Gastrointestinal:  ( - ) nausea, ( - ) heartburn, ( - ) change in bowel habits Skin: ( - ) abnormal skin rashes Lymphatics: ( - ) new lymphadenopathy, ( - ) easy bruising Neurological: ( - ) numbness, ( - ) tingling, ( - ) new weaknesses Behavioral/Psych: ( - ) mood change, ( - ) new changes  All other systems were reviewed with the patient and are negative.  PHYSICAL EXAMINATION:  Vitals:   12/01/20 1425  BP: (!) 148/108  Pulse: 72  Resp: 17  Temp: 98.1 F (36.7 C)  SpO2: 98%   Filed Weights   12/01/20 1425  Weight: 270 lb 6.4 oz (122.7 kg)    GENERAL: well appearing elderly Caucasian male in NAD  SKIN: skin  color, texture, turgor are normal, no rashes or significant lesions EYES: conjunctiva are pink and non-injected, sclera clear LUNGS: clear to auscultation and percussion with normal breathing effort HEART: regular rate & rhythm and no murmurs and no lower extremity edema PSYCH: alert & oriented x 3, fluent speech NEURO: no focal motor/sensory deficits  LABORATORY DATA:  I have reviewed the data as listed CBC Latest Ref Rng & Units 11/15/2020 04/14/2018 03/05/2017  WBC 3.4 -  10.8 x10E3/uL 7.4 6.5 6.1  Hemoglobin 13.0 - 17.7 g/dL 18.4(H) 18.0(H) 17.8(H)  Hematocrit 37.5 - 51.0 % 55.2(H) 54.5(H) 51.2(H)  Platelets 150 - 450 x10E3/uL 222 203 236    CMP Latest Ref Rng & Units 11/15/2020 09/10/2019 07/30/2019  Glucose 65 - 99 mg/dL 74 101(H) 86  BUN 8 - 27 mg/dL 14 17 15   Creatinine 0.76 - 1.27 mg/dL 1.14 1.39(H) 1.21  Sodium 134 - 144 mmol/L 139 140 140  Potassium 3.5 - 5.2 mmol/L 4.9 4.8 4.8  Chloride 96 - 106 mmol/L 96 98 97  CO2 20 - 29 mmol/L 24 28 25   Calcium 8.6 - 10.2 mg/dL 10.1 10.1 10.6(H)  Total Protein 6.0 - 8.5 g/dL 7.4 - -  Total Bilirubin 0.0 - 1.2 mg/dL 0.6 - -  Alkaline Phos 44 - 121 IU/L 115 - -  AST 0 - 40 IU/L 38 - -  ALT 0 - 44 IU/L 26 - -    RADIOGRAPHIC STUDIES: No results found.  ASSESSMENT & PLAN Carlos [name] 65 y.o. male with medical history significant for HLD, HTN, pre DM type II, and low testosterone who presents for evaluation of polycythemia.   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  The patient has a history of low T and for this he is been taking monthly testosterone injections.  This is the most likely reason for his findings, however in order to rule out other etiologies I recommend that we pursue MPN work-up.  Additionally we will order erythropoietin in order to see if this elevated hemoglobin is an  appropriate response from the body.  The patient has no other signs or symptoms concerning for obstructive sleep apnea.  He is also a non-smoker.  In the event that there are no mutations driving this patient's polycythemia there is no need for routine follow-up in our clinic.  If a driver mutation is found we would need to consider bone marrow biopsy in order to further evaluate.  #Polycythemia, Secondary -- Polycythemia is most likely secondary to exogenous testosterone use --We will confirm our suspicion with JAK2 panel with reflex as well as a BCR ABL FISH --We will also order iron studies and erythropoietin.  Unable to collect these today as this is a Friday afternoon.  We will have the patient return to clinic next week for these labs --If no concerning abnormalities are noted in the labs above we will assume this is secondary to his testosterone.  In that case there is no intervention required for his elevated hemoglobin, though it would be reasonable idea to continue ASA 81 mg p.o. daily --Return to clinic pending the results of the above studies.  No orders of the defined types were placed in this encounter.   All questions were answered. The patient knows to call the clinic with any problems, questions or concerns.  A total of more than 60 minutes were spent on this encounter with face-to-face time and non-face-to-face time, including preparing to see the patient, ordering tests and/or medications, counseling the patient and coordination of care as outlined above.   Ledell Peoples, MD Department of Hematology/Oncology [place] [facility] Phone: [phone] Pager: [phone] Email: [email or web address]  12/01/2020 3:11 PM

## 2020-12-05 ENCOUNTER — Encounter: Payer: Self-pay | Admitting: Hematology and Oncology

## 2020-12-05 ENCOUNTER — Telehealth: Payer: Self-pay | Admitting: Hematology and Oncology

## 2020-12-05 NOTE — Telephone Encounter (Signed)
Scheduled appointment per 06/14 sch msg. Patient is aware. 

## 2020-12-07 ENCOUNTER — Other Ambulatory Visit: Payer: Self-pay

## 2020-12-07 ENCOUNTER — Inpatient Hospital Stay: Payer: BC Managed Care – PPO

## 2020-12-07 DIAGNOSIS — E785 Hyperlipidemia, unspecified: Secondary | ICD-10-CM | POA: Diagnosis not present

## 2020-12-07 DIAGNOSIS — D751 Secondary polycythemia: Secondary | ICD-10-CM

## 2020-12-07 DIAGNOSIS — I10 Essential (primary) hypertension: Secondary | ICD-10-CM | POA: Diagnosis not present

## 2020-12-07 LAB — CMP (CANCER CENTER ONLY)
ALT: 22 U/L (ref 0–44)
AST: 32 U/L (ref 15–41)
Albumin: 4.3 g/dL (ref 3.5–5.0)
Alkaline Phosphatase: 112 U/L (ref 38–126)
Anion gap: 10 (ref 5–15)
BUN: 22 mg/dL (ref 8–23)
CO2: 28 mmol/L (ref 22–32)
Calcium: 9.7 mg/dL (ref 8.9–10.3)
Chloride: 101 mmol/L (ref 98–111)
Creatinine: 1.29 mg/dL — ABNORMAL HIGH (ref 0.61–1.24)
GFR, Estimated: >60 mL/min (ref >60)
Glucose, Bld: 91 mg/dL (ref 70–99)
Potassium: 4.4 mmol/L (ref 3.5–5.1)
Sodium: 139 mmol/L (ref 135–145)
Total Bilirubin: 1.1 mg/dL (ref 0.3–1.2)
Total Protein: 7.7 g/dL (ref 6.5–8.1)

## 2020-12-07 LAB — CBC WITH DIFFERENTIAL (CANCER CENTER ONLY)
Abs Immature Granulocytes: 0.02 10*3/uL (ref 0.00–0.07)
Basophils Absolute: 0.1 10*3/uL (ref 0.0–0.1)
Basophils Relative: 1 %
Eosinophils Absolute: 0.1 10*3/uL (ref 0.0–0.5)
Eosinophils Relative: 1 %
HCT: 54.2 % — ABNORMAL HIGH (ref 39.0–52.0)
Hemoglobin: 18.5 g/dL — ABNORMAL HIGH (ref 13.0–17.0)
Immature Granulocytes: 0 %
Lymphocytes Relative: 19 %
Lymphs Abs: 1.5 10*3/uL (ref 0.7–4.0)
MCH: 31.4 pg (ref 26.0–34.0)
MCHC: 34.1 g/dL (ref 30.0–36.0)
MCV: 91.9 fL (ref 80.0–100.0)
Monocytes Absolute: 0.9 10*3/uL (ref 0.1–1.0)
Monocytes Relative: 11 %
Neutro Abs: 5.4 10*3/uL (ref 1.7–7.7)
Neutrophils Relative %: 68 %
Platelet Count: 187 10*3/uL (ref 150–400)
RBC: 5.9 MIL/uL — ABNORMAL HIGH (ref 4.22–5.81)
RDW: 13.2 % (ref 11.5–15.5)
WBC Count: 8 10*3/uL (ref 4.0–10.5)
nRBC: 0 % (ref 0.0–0.2)

## 2020-12-07 LAB — IRON AND TIBC
Iron: 141 ug/dL (ref 42–163)
Saturation Ratios: 35 % (ref 20–55)
TIBC: 398 ug/dL (ref 202–409)
UIBC: 256 ug/dL (ref 117–376)

## 2020-12-08 LAB — FERRITIN: Ferritin: 41 ng/mL (ref 24–336)

## 2020-12-08 LAB — ERYTHROPOIETIN: Erythropoietin: 11.9 m[IU]/mL (ref 2.6–18.5)

## 2020-12-13 LAB — BCR ABL1 FISH (GENPATH)

## 2020-12-13 LAB — JAK2 (INCLUDING V617F AND EXON 12), MPL,& CALR W/RFL MPN PANEL (NGS)

## 2020-12-19 ENCOUNTER — Ambulatory Visit (HOSPITAL_COMMUNITY)
Admission: RE | Admit: 2020-12-19 | Discharge: 2020-12-19 | Disposition: A | Discharge status: Home / Self Care | Payer: BC Managed Care – PPO | Source: Ambulatory Visit | Attending: Vascular Surgery | Admitting: Vascular Surgery

## 2020-12-19 ENCOUNTER — Ambulatory Visit (INDEPENDENT_AMBULATORY_CARE_PROVIDER_SITE_OTHER): Payer: BC Managed Care – PPO | Admitting: Vascular Surgery

## 2020-12-19 ENCOUNTER — Encounter: Payer: Self-pay | Admitting: Vascular Surgery

## 2020-12-19 ENCOUNTER — Other Ambulatory Visit: Payer: Self-pay

## 2020-12-19 VITALS — BP 145/92 | HR 69 | Temp 98.2°F | Resp 20 | Ht 71.0 in | Wt 267.0 lb

## 2020-12-19 DIAGNOSIS — I75029 Atheroembolism of unspecified lower extremity: Secondary | ICD-10-CM | POA: Diagnosis not present

## 2020-12-19 DIAGNOSIS — I7300 Raynaud's syndrome without gangrene: Secondary | ICD-10-CM

## 2020-12-19 NOTE — Progress Notes (Signed)
[facility] [place]  ASSESSMENT / PLAN: 65 y.o. 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  No evidence of clinically significant peripheral arterial disease on clinical exam or noninvasive testing today.  I reassured the patient about the diagnosis and supportive measures that can be taken to help symptoms.  Follow-up with me as needed.  CHIEF COMPLAINT: Blue toes  HISTORY OF PRESENT ILLNESS: Carlos [name] is a 65 y.o. male referred to clinic for evaluation of bilateral blue toes.  The discoloration is intermittent and seems to come and go.  This has been occurring for most of his life.  Concerned about his circulation, he sought advice from his primary care physician who referred him to our office.  The patient reports no symptoms typical of intermittent claudication, ischemic rest pain, or ischemic ulceration.  He is fairly active and enjoys fishing.  He notices the blue discoloration about his foot whenever he is barefoot or exposed to the cold.  The discoloration is only mildly bothersome to him.  Past Medical History:  Diagnosis Date   Hyperlipidemia    Hypertension    Low testosterone    Pre-diabetes 06/22/2018   Prediabetes    pre-diabetic    Past Surgical History:  Procedure Laterality Date   COLONOSCOPY     MENISCUS REPAIR     ORIF TIBIA PLATEAU Left 11/09/2015   Procedure: OPEN REDUCTION INTERNAL (ORIF) FIXATION LEFT LATERAL TIBIAL PLATEAU;  Surgeon: Justice Britain, MD;  Location: [facility];  Service: Orthopedics;  Laterality: Left;   ROTATOR CUFF REPAIR Right 2002   VASECTOMY      Family History  Problem Relation Age of Onset   Cancer Mother        "male"   Lung cancer Father    Bladder Cancer Sister    Colon cancer Neg Hx    Colon polyps Neg Hx    Esophageal cancer Neg Hx    Rectal cancer Neg Hx    Stomach cancer Neg Hx     Social History   Socioeconomic History   Marital status: Married    Spouse name: Not on  file   Number of children: Not on file   Years of education: Not on file   Highest education level: Not on file  Occupational History   Not on file  Tobacco Use   Smoking status: Never   Smokeless tobacco: Never  Vaping Use   Vaping Use: Never used  Substance and Sexual Activity   Alcohol use: Yes    Alcohol/week: 0.0 standard drinks    Comment: occasional/social   Drug use: No   Sexual activity: Yes    Birth control/protection: Surgical  Other Topics Concern   Not on file  Social History Narrative   Not on file   Social Determinants of Health   Financial Resource Strain: Not on file  Food Insecurity: Not on file  Transportation Needs: Not on file  Physical Activity: Not on file  Stress: Not on file  Social Connections: Not on file  Intimate Partner Violence: Not on file    Allergies  Allergen Reactions   Sulfa Antibiotics Other (See Comments)    Reaction unknown occurred during childhood    Current Outpatient Medications  Medication Sig Dispense Refill   aspirin EC 81 MG tablet Take 1 tablet (81 mg total) by mouth daily.     atorvastatin (LIPITOR) 80 MG tablet TAKE 1 TABLET BY MOUTH DAILY 90 tablet 1  hydrochlorothiazide (HYDRODIURIL) 25 MG tablet TAKE 1 TABLET BY MOUTH DAILY 90 tablet 1   losartan (COZAAR) 50 MG tablet Take 1 tablet (50 mg total) by mouth daily. 90 tablet 3   testosterone cypionate (DEPOTESTOSTERONE CYPIONATE) 200 MG/ML injection SMARTSIG:0.5 Milliliter(s) IM Once a Week     No current facility-administered medications for this visit.    REVIEW OF SYSTEMS:  [X]  denotes positive finding, [ ]  denotes negative finding Cardiac  Comments:  Chest pain or chest pressure:    Shortness of breath upon exertion:    Short of breath when lying flat:    Irregular heart rhythm:        Vascular    Pain in calf, thigh, or hip brought on by ambulation:    Pain in feet at night that wakes you up from your sleep:     Blood clot in your veins:    Leg  swelling:         Pulmonary    Oxygen at home:    Productive cough:     Wheezing:         Neurologic    Sudden weakness in arms or legs:     Sudden numbness in arms or legs:     Sudden onset of difficulty speaking or slurred speech:    Temporary loss of vision in one eye:     Problems with dizziness:         Gastrointestinal    Blood in stool:     Vomited blood:         Genitourinary    Burning when urinating:     Blood in urine:        Psychiatric    Major depression:         Hematologic    Bleeding problems:    Problems with blood clotting too easily:        Skin    Rashes or ulcers:        Constitutional    Fever or chills:      PHYSICAL EXAM Vitals:   12/19/20 1320  BP: (!) 145/92  Pulse: 69  Resp: 20  Temp: 98.2 F (36.8 C)  SpO2: 98%  Weight: 267 lb (121.1 kg)  Height: 5\' 11"  (1.803 m)    Constitutional: well appearing. no distress. Appears well nourished.  Neurologic: CN intact. no focal findings. no sensory loss. Psychiatric:  Mood and affect symmetric and appropriate. Eyes:  No icterus. No conjunctival pallor. Ears, nose, throat:  mucous membranes moist. Midline trachea.  Cardiac: regular rate and rhythm.  Respiratory:  unlabored. Abdominal:  soft, non-tender, non-distended.  Peripheral vascular: 2+ DPs Extremity: no edema. Toe tips with blanching cyanosis typical of Raynaud's. no pallor.  Skin: no gangrene. no ulceration.  Lymphatic: no Stemmer's sign. no palpable lymphadenopathy.  PERTINENT LABORATORY AND RADIOLOGIC DATA  Most recent CBC CBC Latest Ref Rng & Units 12/07/2020 11/15/2020 04/14/2018  WBC 4.0 - 10.5 K/uL 8.0 7.4 6.5  Hemoglobin 13.0 - 17.0 g/dL 18.5(H) 18.4(H) 18.0(H)  Hematocrit 39.0 - 52.0 % 54.2(H) 55.2(H) 54.5(H)  Platelets 150 - 400 K/uL 187 222 203     Most recent CMP CMP Latest Ref Rng & Units 12/07/2020 11/15/2020 09/10/2019  Glucose 70 - 99 mg/dL 91 74 101(H)  BUN 8 - 23 mg/dL 22 14 17   Creatinine 0.61 - 1.24  mg/dL 1.29(H) 1.14 1.39(H)  Sodium 135 - 145 mmol/L 139 139 140  Potassium 3.5 - 5.1 mmol/L 4.4 4.9 4.8  Chloride 98 - 111 mmol/L 101 96 98  CO2 22 - 32 mmol/L 28 24 28   Calcium 8.9 - 10.3 mg/dL 9.7 10.1 10.1  Total Protein 6.5 - 8.1 g/dL 7.7 7.4 -  Total Bilirubin 0.3 - 1.2 mg/dL 1.1 0.6 -  Alkaline Phos 38 - 126 U/L 112 115 -  AST 15 - 41 U/L 32 38 -  ALT 0 - 44 U/L 22 26 -    Renal function CrCl cannot be calculated (Unknown ideal weight.).  Hemoglobin A1C (no units)  Date Value  10/16/2015 5.5   Hgb A1c MFr Bld (%)  Date Value  11/15/2020 5.5    LDL Chol Calc (NIH)  Date Value Ref Range Status  11/15/2020 88 0 - 99 mg/dL Final     Vascular Imaging: ABI 12/19/20   Yevonne Aline. Stanford Breed, MD [facility] Phone Number: [phone] 12/19/2020 12:59 PM

## 2020-12-21 ENCOUNTER — Encounter: Payer: Self-pay | Admitting: Hematology and Oncology

## 2020-12-22 ENCOUNTER — Telehealth: Payer: Self-pay | Admitting: *Deleted

## 2020-12-22 NOTE — Telephone Encounter (Signed)
Received vm message from patient regarding his JAK 2 results. Pt is requesting an explanation of results from Dr. Lorenso Courier.  He is requesting a call back.

## 2021-01-02 ENCOUNTER — Encounter: Payer: Self-pay | Admitting: Gastroenterology

## 2021-01-11 ENCOUNTER — Other Ambulatory Visit: Payer: Self-pay

## 2021-01-11 DIAGNOSIS — I10 Essential (primary) hypertension: Secondary | ICD-10-CM

## 2021-01-11 MED ORDER — LOSARTAN POTASSIUM 50 MG PO TABS: 50.0000 mg | ORAL_TABLET | Freq: Every day | ORAL | 0 refills | Status: DC

## 2021-01-11 NOTE — Progress Notes (Signed)
Per patient request Losartan 50mg  refilled for courtesy 30 day supply. Please  schedule appointment for additional refills

## 2021-01-14 ENCOUNTER — Other Ambulatory Visit: Payer: Self-pay | Admitting: Family

## 2021-01-14 DIAGNOSIS — I10 Essential (primary) hypertension: Secondary | ICD-10-CM

## 2021-02-09 ENCOUNTER — Other Ambulatory Visit: Payer: Self-pay | Admitting: Family

## 2021-02-09 DIAGNOSIS — I10 Essential (primary) hypertension: Secondary | ICD-10-CM

## 2021-03-15 ENCOUNTER — Ambulatory Visit (AMBULATORY_SURGERY_CENTER): Payer: BC Managed Care – PPO

## 2021-03-15 ENCOUNTER — Encounter: Payer: Self-pay | Admitting: Gastroenterology

## 2021-03-15 VITALS — Ht 71.0 in | Wt 275.0 lb

## 2021-03-15 DIAGNOSIS — Z86010 Personal history of colonic polyps: Secondary | ICD-10-CM

## 2021-03-15 MED ORDER — PEG-KCL-NACL-NASULF-NA ASC-C 100 G PO SOLR: 1.0000 | Freq: Once | ORAL | 0 refills | Status: AC

## 2021-03-15 NOTE — Progress Notes (Signed)
No egg or soy allergy known to patient  No issues known to pt with past sedation with any surgeries or procedures Patient denies ever being told they had issues or difficulty with intubation  No FH of Malignant Hyperthermia Pt is not on diet pills Pt is not on  home 02  Pt is not on blood thinners  Pt denies issues with constipation  No A fib or A flutter  EMMI video to pt or via [place] 19 guidelines implemented in PV today with Pt and RN   Pt is fully vaccinated  for [employer] given to pt in PV today , Code to Pharmacy and  NO PA's for preps discussed with pt In PV today  Discussed with pt there will be an out-of-pocket cost for prep and that varies from $0 to 70 +  dollars   Due to the COVID-19 pandemic we are asking patients to follow certain guidelines.  Pt aware of COVID protocols and [facility] guidelines

## 2021-03-27 ENCOUNTER — Telehealth: Payer: Self-pay | Admitting: Gastroenterology

## 2021-03-27 DIAGNOSIS — Z86010 Personal history of colonic polyps: Secondary | ICD-10-CM

## 2021-03-27 MED ORDER — PEG 3350-KCL-NA BICARB-NACL 420 G PO SOLR: 4000.0000 mL | Freq: Once | ORAL | 0 refills | Status: AC

## 2021-03-27 NOTE — Telephone Encounter (Signed)
Patient called states insurance requires PA for Moviprep medication requesting an alternative please call patient once done so he knows.

## 2021-03-27 NOTE — Telephone Encounter (Signed)
Pt states Movi needs a PA- informed no Pa for preps- golytely is only covered prep- sent in new script to [facility], new instructions to  my chart for pt- we went over prep over the phone today - pt will call with questions

## 2021-03-28 ENCOUNTER — Encounter: Payer: Self-pay | Admitting: Certified Registered Nurse Anesthetist

## 2021-03-29 ENCOUNTER — Other Ambulatory Visit: Payer: Self-pay

## 2021-03-29 ENCOUNTER — Encounter: Payer: Self-pay | Admitting: Gastroenterology

## 2021-03-29 ENCOUNTER — Ambulatory Visit (AMBULATORY_SURGERY_CENTER): Payer: BC Managed Care – PPO | Admitting: Gastroenterology

## 2021-03-29 VITALS — BP 123/77 | HR 59 | Temp 98.6°F | Resp 19 | Ht 71.0 in | Wt 275.0 lb

## 2021-03-29 DIAGNOSIS — Z1211 Encounter for screening for malignant neoplasm of colon: Secondary | ICD-10-CM | POA: Diagnosis not present

## 2021-03-29 DIAGNOSIS — K635 Polyp of colon: Secondary | ICD-10-CM | POA: Diagnosis not present

## 2021-03-29 DIAGNOSIS — D127 Benign neoplasm of rectosigmoid junction: Secondary | ICD-10-CM | POA: Diagnosis not present

## 2021-03-29 DIAGNOSIS — Z86010 Personal history of colonic polyps: Secondary | ICD-10-CM

## 2021-03-29 HISTORY — PX: COLONOSCOPY WITH PROPOFOL: SHX5780

## 2021-03-29 MED ORDER — SODIUM CHLORIDE 0.9 % IV SOLN: 500.0000 mL | Freq: Once | INTRAVENOUS | Status: DC

## 2021-03-29 NOTE — Op Note (Signed)
[facility] Patient Name: Carlos [name] Procedure Date: 03/29/2021 8:26 AM MRN: [id number] Endoscopist: Carlos [name] , MD Age: 65 Referring MD:  Date of Birth: December 25, 1955 Gender: Male Account #: [id number] Procedure:                Colonoscopy Indications:              High risk colon cancer surveillance: Personal                            history of colonic polyps - 5 adenomas / sessile                            serrated polyps in 05/2017 Medicines:                Monitored Anesthesia Care Procedure:                Pre-Anesthesia Assessment:                           - Prior to the procedure, a History and Physical                            was performed, and patient medications and                            allergies were reviewed. The patient's tolerance of                            previous anesthesia was also reviewed. The risks                            and benefits of the procedure and the sedation                            options and risks were discussed with the patient.                            All questions were answered, and informed consent                            was obtained. Prior Anticoagulants: The patient has                            taken no previous anticoagulant or antiplatelet                            agents. ASA Grade Assessment: II - A patient with                            mild systemic disease. After reviewing the risks                            and benefits, the patient was deemed in  satisfactory condition to undergo the procedure.                           After obtaining informed consent, the colonoscope                            was passed under direct vision. Throughout the                            procedure, the patient's blood pressure, pulse, and                            oxygen saturations were monitored continuously. The                            CF [id number] #[id number] was introduced  through the anus                            and advanced to the the cecum, identified by                            appendiceal orifice and ileocecal valve. The                            colonoscopy was performed without difficulty. The                            patient tolerated the procedure well. The quality                            of the bowel preparation was adequate. The                            ileocecal valve, appendiceal orifice, and rectum                            were photographed. Scope In: 8:49:29 AM Scope Out: 9:05:43 AM Scope Withdrawal Time: 0 hours 14 minutes 7 seconds  Total Procedure Duration: 0 hours 16 minutes 14 seconds  Findings:                 The perianal and digital rectal examinations were                            normal.                           Multiple medium-mouthed diverticula were found in                            the entire colon, highest burden in sigmoid colon,                            mild in tranverse and right colon.  A 3 mm polyp was found in the recto-sigmoid colon.                            The polyp was sessile. The polyp was removed with a                            cold snare. Resection and retrieval were complete.                           Internal hemorrhoids were found during retroflexion.                           The exam was otherwise without abnormality. Complications:            No immediate complications. Estimated blood loss:                            Minimal. Estimated Blood Loss:     Estimated blood loss was minimal. Impression:               - Diverticulosis in the entire examined colon.                           - One 3 mm polyp at the recto-sigmoid colon,                            removed with a cold snare. Resected and retrieved.                           - Internal hemorrhoids.                           - The examination was otherwise normal. Recommendation:           - Patient has  a contact number available for                            emergencies. The signs and symptoms of potential                            delayed complications were discussed with the                            patient. Return to normal activities tomorrow.                            Written discharge instructions were provided to the                            patient.                           - Resume previous diet.                           - Continue present medications.                           -  Await pathology results. Carlos Lipps P. Carlos Gwynne, MD 03/29/2021 9:10:46 AM This report has been signed electronically.

## 2021-03-29 NOTE — Progress Notes (Signed)
Called to room to assist during endoscopic procedure.  Patient ID and intended procedure confirmed with present staff. Received instructions for my participation in the procedure from the performing physician.  

## 2021-03-29 NOTE — Patient Instructions (Signed)
Handouts provided on polyps, diverticulosis and hemorrhoids.   YOU HAD AN ENDOSCOPIC PROCEDURE TODAY AT THE [facility]:   Refer to the procedure report that was given to you for any specific questions about what was found during the examination.  If the procedure report does not answer your questions, please call your gastroenterologist to clarify.  If you requested that your care partner not be given the details of your procedure findings, then the procedure report has been included in a sealed envelope for you to review at your convenience later.  YOU SHOULD EXPECT: Some feelings of bloating in the abdomen. Passage of more gas than usual.  Walking can help get rid of the air that was put into your GI tract during the procedure and reduce the bloating. If you had a lower endoscopy (such as a colonoscopy or flexible sigmoidoscopy) you may notice spotting of blood in your stool or on the toilet paper. If you underwent a bowel prep for your procedure, you may not have a normal bowel movement for a few days.  Please Note:  You might notice some irritation and congestion in your nose or some drainage.  This is from the oxygen used during your procedure.  There is no need for concern and it should clear up in a day or so.  SYMPTOMS TO REPORT IMMEDIATELY:   Following lower endoscopy (colonoscopy or flexible sigmoidoscopy):  Excessive amounts of blood in the stool  Significant tenderness or worsening of abdominal pains  Swelling of the abdomen that is new, acute  Fever of 100F or higher   For urgent or emergent issues, a gastroenterologist can be reached at any hour by calling [phone]. Do not use MyChart messaging for urgent concerns.    DIET:  We do recommend a small meal at first, but then you may proceed to your regular diet.  Drink plenty of fluids but you should avoid alcoholic beverages for 24 hours.  ACTIVITY:  You should plan to take it easy for the rest of today and  you should NOT DRIVE or use heavy machinery until tomorrow (because of the sedation medicines used during the test).    FOLLOW UP: Our staff will call the number listed on your records 48-72 hours following your procedure to check on you and address any questions or concerns that you may have regarding the information given to you following your procedure. If we do not reach you, we will leave a message.  We will attempt to reach you two times.  During this call, we will ask if you have developed any symptoms of COVID 19. If you develop any symptoms (ie: fever, flu-like symptoms, shortness of breath, cough etc.) before then, please call [phone].  If you test positive for Covid 19 in the 2 weeks post procedure, please call and report this information to us.    If any biopsies were taken you will be contacted by phone or by letter within the next 1-3 weeks.  Please call us at [phone] if you have not heard about the biopsies in 3 weeks.    SIGNATURES/CONFIDENTIALITY: You and/or your care partner have signed paperwork which will be entered into your electronic medical record.  These signatures attest to the fact that that the information above on your After Visit Summary has been reviewed and is understood.  Full responsibility of the confidentiality of this discharge information lies with you and/or your care-partner.  

## 2021-03-29 NOTE — Progress Notes (Signed)
Pt's states no medical or surgical changes since previsit or office visit.   Vitals AG 

## 2021-03-29 NOTE — Progress Notes (Signed)
[facility] History and Physical   Primary Care Physician:  Nicolette Bang, MD   Reason for Procedure:   History of colon polyps  Plan:    colonoscopy     HPI: Carlos [name] is a 65 y.o. male  here for colonoscopy surveillance - 5 adenomas / sessile serrated polyps removed 05/2017. Patient denies any bowel symptoms at this time. No family history of colon cancer known. Otherwise feels well without any cardiopulmonary symptoms.    Past Medical History:  Diagnosis Date   Hyperlipidemia    Hypertension    Low testosterone    Pre-diabetes 06/22/2018   03/15/21- pt states not prediabetic as of now    Past Surgical History:  Procedure Laterality Date   COLONOSCOPY  06/20/2017   COLONOSCOPY WITH PROPOFOL  03/29/2021   MENISCUS REPAIR     ORIF TIBIA PLATEAU Left 11/09/2015   Procedure: OPEN REDUCTION INTERNAL (ORIF) FIXATION LEFT LATERAL TIBIAL PLATEAU;  Surgeon: Justice Britain, MD;  Location: [facility];  Service: Orthopedics;  Laterality: Left;   ROTATOR CUFF REPAIR Right 2002   VASECTOMY      Prior to Admission medications   Medication Sig Start Date End Date Taking? Authorizing Provider  aspirin EC 81 MG tablet Take 1 tablet (81 mg total) by mouth daily. 07/30/19  Yes Nicolette Bang, MD  atorvastatin (LIPITOR) 80 MG tablet TAKE 1 TABLET BY MOUTH DAILY 11/21/20  Yes Nicolette Bang, MD  hydrochlorothiazide (HYDRODIURIL) 25 MG tablet TAKE 1 TABLET BY MOUTH DAILY 11/21/20  Yes Nicolette Bang, MD  losartan (COZAAR) 50 MG tablet TAKE 1 TABLET BY MOUTH DAILY 02/13/21  Yes Dorna Mai, MD  Omega-3 Fatty Acids (FISH OIL OMEGA-3 PO) Take by mouth.   Yes [provider]  testosterone cypionate (DEPOTESTOSTERONE CYPIONATE) 200 MG/ML injection SMARTSIG:0.5 Milliliter(s) IM Once a Week 08/25/19  Yes [provider]    Current Outpatient Medications  Medication Sig Dispense Refill   aspirin EC 81 MG tablet Take 1 tablet (81  mg total) by mouth daily.     atorvastatin (LIPITOR) 80 MG tablet TAKE 1 TABLET BY MOUTH DAILY 90 tablet 1   hydrochlorothiazide (HYDRODIURIL) 25 MG tablet TAKE 1 TABLET BY MOUTH DAILY 90 tablet 1   losartan (COZAAR) 50 MG tablet TAKE 1 TABLET BY MOUTH DAILY 30 tablet 0   Omega-3 Fatty Acids (FISH OIL OMEGA-3 PO) Take by mouth.     testosterone cypionate (DEPOTESTOSTERONE CYPIONATE) 200 MG/ML injection SMARTSIG:0.5 Milliliter(s) IM Once a Week     Current Facility-Administered Medications  Medication Dose Route Frequency Provider Last Rate Last Admin   0.9 %  sodium chloride infusion  500 mL Intravenous Once Shavona Gunderman, Carlota Raspberry, MD        Allergies as of 03/29/2021 - Review Complete 03/29/2021  Allergen Reaction Noted   Sulfa antibiotics Other (See Comments) 03/28/2015    Family History  Problem Relation Age of Onset   Cancer Mother        "male"   Lung cancer Father    Bladder Cancer Sister    Colon cancer Neg Hx    Colon polyps Neg Hx    Esophageal cancer Neg Hx    Rectal cancer Neg Hx    Stomach cancer Neg Hx     Social History   Socioeconomic History   Marital status: Married    Spouse name: Not on file   Number of children: Not on file   Years of education: Not on file  Highest education level: Not on file  Occupational History   Not on file  Tobacco Use   Smoking status: Never    Passive exposure: Past (as a child)   Smokeless tobacco: Never  Vaping Use   Vaping Use: Never used  Substance and Sexual Activity   Alcohol use: Yes    Alcohol/week: 4.0 standard drinks    Types: 1 Glasses of wine, 1 Shots of liquor, 2 Standard drinks or equivalent per week   Drug use: Never   Sexual activity: Yes    Birth control/protection: Surgical  Other Topics Concern   Not on file  Social History Narrative   Not on file   Social Determinants of Health   Financial Resource Strain: Not on file  Food Insecurity: Not on file  Transportation Needs: Not on file   Physical Activity: Not on file  Stress: Not on file  Social Connections: Not on file  Intimate Partner Violence: Not on file    Review of Systems: All other review of systems negative except as mentioned in the HPI.  Physical Exam: Vital signs BP (!) 158/88   Pulse 67   Temp 98.6 F (37 C) (Temporal)   Resp 11   Ht 5\' 11"  (1.803 m)   Wt 275 lb (124.7 kg) Comment: stated  SpO2 97%   BMI 38.35 kg/m   General:   Alert,  Well-developed,  pleasant and cooperative in NAD Lungs:  Clear throughout to auscultation.   Heart:  Regular rate and rhythm Abdomen:  Soft, nontender and nondistended.   Neuro/Psych:  Alert and cooperative. Normal mood and affect. A and O x 3  Jolly Mango, MD [facility]

## 2021-03-29 NOTE — Progress Notes (Signed)
Report given to PACU, vss 

## 2021-04-02 ENCOUNTER — Telehealth: Payer: Self-pay

## 2021-04-02 NOTE — Telephone Encounter (Signed)
  Follow up Call-  Call back number 03/29/2021  Post procedure Call Back phone  # [phone]  Permission to leave phone message Yes  Some recent data might be hidden     Patient questions:  Do you have a fever, pain , or abdominal swelling? No. Pain Score  0 *  Have you tolerated food without any problems? Yes.    Have you been able to return to your normal activities? Yes.    Do you have any questions about your discharge instructions: Diet   No. Medications  No. Follow up visit  No.  Do you have questions or concerns about your Care? No.  Actions: * If pain score is 4 or above: No action needed, pain <4.  Have you developed a fever since your procedure? no  2.   Have you had an respiratory symptoms (SOB or cough) since your procedure? no  3.   Have you tested positive for COVID 19 since your procedure no  4.   Have you had any family members/close contacts diagnosed with the COVID 19 since your procedure?  no   If yes to any of these questions please route to Joylene John, RN and Joella Prince, RN

## 2021-04-03 ENCOUNTER — Other Ambulatory Visit: Payer: Self-pay | Admitting: Family Medicine

## 2021-04-03 DIAGNOSIS — I10 Essential (primary) hypertension: Secondary | ICD-10-CM

## 2021-05-11 NOTE — Progress Notes (Signed)
Patient ID: JAMA KRICHBAUM, male    DOB: [birth year]  MRN: [id number]  CC: Hypertension Follow-Up  Subjective: Carlos [name] is a 65 y.o. male who presents for hypertension follow-up.   His concerns today include:   HYPERTENSION FOLLOW-UP: 09/03/2019 per DO note: BPs not always at goal but more optimally controlled with second agent. Given side effects, have discontinued Amlodipine and will start Losartan. Check BMET one week after starting new medication.   05/14/2021: Patient reports 4 weeks without Losartan related to being unable to get refills. Home blood pressures 140's/80's. Denies chest pain, shortness of breath, and additional red flag symptoms. No issues/concerns.   Patient Active Problem List   Diagnosis Date Noted   Hyperlipidemia 07/20/2019   Elevated PSA 06/04/2017   Hypogonadism in male 09/15/2007   Essential hypertension 09/15/2007   DIVERTICULITIS OF COLON 09/15/2007     Current Outpatient Medications on File Prior to Visit  Medication Sig Dispense Refill   aspirin EC 81 MG tablet Take 1 tablet (81 mg total) by mouth daily.     atorvastatin (LIPITOR) 80 MG tablet TAKE 1 TABLET BY MOUTH DAILY 90 tablet 1   Omega-3 Fatty Acids (FISH OIL OMEGA-3 PO) Take by mouth.     polyethylene glycol-electrolytes (NULYTELY) 420 g solution Take by mouth.     testosterone cypionate (DEPOTESTOSTERONE CYPIONATE) 200 MG/ML injection SMARTSIG:0.5 Milliliter(s) IM Once a Week     No current facility-administered medications on file prior to visit.    Allergies  Allergen Reactions   Sulfa Antibiotics Other (See Comments)    Reaction unknown occurred during childhood    Social History   Socioeconomic History   Marital status: Married    Spouse name: Not on file   Number of children: Not on file   Years of education: Not on file   Highest education level: Not on file  Occupational History   Not on file  Tobacco Use   Smoking status: Never    Passive exposure: Past (as a  child)   Smokeless tobacco: Never  Vaping Use   Vaping Use: Never used  Substance and Sexual Activity   Alcohol use: Yes    Alcohol/week: 4.0 standard drinks    Types: 1 Glasses of wine, 1 Shots of liquor, 2 Standard drinks or equivalent per week   Drug use: Never   Sexual activity: Yes    Birth control/protection: Surgical  Other Topics Concern   Not on file  Social History Narrative   Not on file   Social Determinants of Health   Financial Resource Strain: Not on file  Food Insecurity: Not on file  Transportation Needs: Not on file  Physical Activity: Not on file  Stress: Not on file  Social Connections: Not on file  Intimate Partner Violence: Not on file    Family History  Problem Relation Age of Onset   Cancer Mother        "male"   Lung cancer Father    Bladder Cancer Sister    Colon cancer Neg Hx    Colon polyps Neg Hx    Esophageal cancer Neg Hx    Rectal cancer Neg Hx    Stomach cancer Neg Hx     Past Surgical History:  Procedure Laterality Date   COLONOSCOPY  06/20/2017   COLONOSCOPY WITH PROPOFOL  03/29/2021   MENISCUS REPAIR     ORIF TIBIA PLATEAU Left 11/09/2015   Procedure: OPEN REDUCTION INTERNAL (ORIF) FIXATION LEFT LATERAL TIBIAL PLATEAU;  Surgeon: Justice Britain, MD;  Location: [facility];  Service: Orthopedics;  Laterality: Left;   ROTATOR CUFF REPAIR Right 2002   VASECTOMY      ROS: Review of Systems Negative except as stated above  PHYSICAL EXAM: BP (!) 145/85   Pulse 88   Temp 98.5 F (36.9 C)   Resp 18   Ht 5' 10.98" (1.803 m)   Wt 260 lb (117.9 kg)   SpO2 97%   BMI 36.28 kg/m   Physical Exam HENT:     Head: Normocephalic and atraumatic.  Eyes:     Extraocular Movements: Extraocular movements intact.     Conjunctiva/sclera: Conjunctivae normal.     Pupils: Pupils are equal, round, and reactive to light.  Cardiovascular:     Rate and Rhythm: Normal rate and regular rhythm.     Pulses: Normal pulses.     Heart sounds: Normal  heart sounds.  Pulmonary:     Effort: Pulmonary effort is normal.     Breath sounds: Normal breath sounds.  Musculoskeletal:     Cervical back: Normal range of motion and neck supple.  Neurological:     General: No focal deficit present.     Mental Status: He is alert and oriented to person, place, and time.  Psychiatric:        Mood and Affect: Mood normal.        Behavior: Behavior normal.   ASSESSMENT AND PLAN: 1. Essential hypertension: - Continue Losartan and Hydrochlorothiazide as prescribed.  - Counseled on blood pressure goal of less than 140/90, low-sodium, DASH diet, medication compliance, 150 minutes of moderate intensity exercise per week as tolerated. Discussed medication compliance, adverse effects. - BMP to evaluate kidney function and electrolyte balance. - Follow-up with primary provider in 3 months or sooner if needed.  - Basic Metabolic Panel - losartan (COZAAR) 50 MG tablet; Take 1 tablet (50 mg total) by mouth daily.  Dispense: 90 tablet; Refill: 0 - hydrochlorothiazide (HYDRODIURIL) 25 MG tablet; Take 1 tablet (25 mg total) by mouth daily.  Dispense: 90 tablet; Refill: 0    Patient was given the opportunity to ask questions.  Patient verbalized understanding of the plan and was able to repeat key elements of the plan. Patient was given clear instructions to go to Emergency Department or return to medical center if symptoms don't improve, worsen, or new problems develop.The patient verbalized understanding.   Orders Placed This Encounter  Procedures   Basic Metabolic Panel     Requested Prescriptions   Signed Prescriptions Disp Refills   losartan (COZAAR) 50 MG tablet 90 tablet 0    Sig: Take 1 tablet (50 mg total) by mouth daily.   hydrochlorothiazide (HYDRODIURIL) 25 MG tablet 90 tablet 0    Sig: Take 1 tablet (25 mg total) by mouth daily.    Return in about 3 months (around 08/14/2021) for Follow-Up or next available hypertension.  Camillia Herter,  NP

## 2021-05-14 ENCOUNTER — Ambulatory Visit (INDEPENDENT_AMBULATORY_CARE_PROVIDER_SITE_OTHER): Payer: PPO | Admitting: Family

## 2021-05-14 ENCOUNTER — Encounter: Payer: Self-pay | Admitting: Family

## 2021-05-14 ENCOUNTER — Other Ambulatory Visit: Payer: Self-pay

## 2021-05-14 VITALS — BP 145/85 | HR 88 | Temp 98.5°F | Resp 18 | Ht 70.98 in | Wt 260.0 lb

## 2021-05-14 DIAGNOSIS — I10 Essential (primary) hypertension: Secondary | ICD-10-CM

## 2021-05-14 MED ORDER — HYDROCHLOROTHIAZIDE 25 MG PO TABS: 25.0000 mg | ORAL_TABLET | Freq: Every day | ORAL | 0 refills | Status: DC

## 2021-05-14 MED ORDER — LOSARTAN POTASSIUM 50 MG PO TABS: 50.0000 mg | ORAL_TABLET | Freq: Every day | ORAL | 0 refills | Status: DC

## 2021-05-14 NOTE — Progress Notes (Signed)
Pt presents for hypertension follow-up, pt needs 90 day med refills , has been out of Losartan for approx 1 month

## 2021-05-15 LAB — BASIC METABOLIC PANEL
BUN/Creatinine Ratio: 11 (ref 10–24)
BUN: 13 mg/dL (ref 8–27)
CO2: 27 mmol/L (ref 20–29)
Calcium: 10 mg/dL (ref 8.6–10.2)
Chloride: 102 mmol/L (ref 96–106)
Creatinine, Ser: 1.18 mg/dL (ref 0.76–1.27)
Glucose: 81 mg/dL (ref 70–99)
Potassium: 4.8 mmol/L (ref 3.5–5.2)
Sodium: 142 mmol/L (ref 134–144)
eGFR: 68 mL/min/{1.73_m2} (ref >59)

## 2021-05-15 NOTE — Progress Notes (Signed)
Kidney function normal

## 2021-07-12 ENCOUNTER — Other Ambulatory Visit: Payer: Self-pay | Admitting: Internal Medicine

## 2021-07-12 DIAGNOSIS — E785 Hyperlipidemia, unspecified: Secondary | ICD-10-CM

## 2021-08-14 ENCOUNTER — Other Ambulatory Visit: Payer: Self-pay

## 2021-08-14 ENCOUNTER — Ambulatory Visit (INDEPENDENT_AMBULATORY_CARE_PROVIDER_SITE_OTHER): Payer: PPO | Admitting: Family Medicine

## 2021-08-14 ENCOUNTER — Ambulatory Visit: Payer: PPO | Admitting: Family

## 2021-08-14 ENCOUNTER — Encounter: Payer: Self-pay | Admitting: Family Medicine

## 2021-08-14 DIAGNOSIS — I10 Essential (primary) hypertension: Secondary | ICD-10-CM | POA: Diagnosis not present

## 2021-08-14 DIAGNOSIS — E785 Hyperlipidemia, unspecified: Secondary | ICD-10-CM

## 2021-08-14 MED ORDER — LOSARTAN POTASSIUM 50 MG PO TABS: 50.0000 mg | ORAL_TABLET | Freq: Every day | ORAL | 0 refills | Status: DC

## 2021-08-14 MED ORDER — LOSARTAN POTASSIUM 50 MG PO TABS: 50.0000 mg | ORAL_TABLET | Freq: Every day | ORAL | 1 refills | Status: DC

## 2021-08-14 MED ORDER — HYDROCHLOROTHIAZIDE 25 MG PO TABS: 25.0000 mg | ORAL_TABLET | Freq: Every day | ORAL | 0 refills | Status: DC

## 2021-08-14 MED ORDER — ATORVASTATIN CALCIUM 80 MG PO TABS: 80.0000 mg | ORAL_TABLET | Freq: Every day | ORAL | 1 refills | Status: DC

## 2021-08-14 MED ORDER — HYDROCHLOROTHIAZIDE 25 MG PO TABS: 25.0000 mg | ORAL_TABLET | Freq: Every day | ORAL | 1 refills | Status: DC

## 2021-08-14 NOTE — Progress Notes (Signed)
Established Patient Office Visit  Subjective:  Patient ID: Carlos [name], male    DOB: Mar 07, 1956  Age: 66 y.o. MRN: [id number]  CC:  Chief Complaint  Patient presents with   Hypertension    HPI Carlos [name] presents for follow up of chronic med issues including hypertension. Patient denies acute complaints or concerns.   Past Medical History:  Diagnosis Date   Hyperlipidemia    Hypertension    Low testosterone    Pre-diabetes 06/22/2018   03/15/21- pt states not prediabetic as of now    Past Surgical History:  Procedure Laterality Date   COLONOSCOPY  06/20/2017   COLONOSCOPY WITH PROPOFOL  03/29/2021   MENISCUS REPAIR     ORIF TIBIA PLATEAU Left 11/09/2015   Procedure: OPEN REDUCTION INTERNAL (ORIF) FIXATION LEFT LATERAL TIBIAL PLATEAU;  Surgeon: Justice Britain, MD;  Location: [facility];  Service: Orthopedics;  Laterality: Left;   ROTATOR CUFF REPAIR Right 2002   VASECTOMY      Family History  Problem Relation Age of Onset   Cancer Mother        "male"   Lung cancer Father    Bladder Cancer Sister    Colon cancer Neg Hx    Colon polyps Neg Hx    Esophageal cancer Neg Hx    Rectal cancer Neg Hx    Stomach cancer Neg Hx     Social History   Socioeconomic History   Marital status: Married    Spouse name: Not on file   Number of children: Not on file   Years of education: Not on file   Highest education level: Not on file  Occupational History   Not on file  Tobacco Use   Smoking status: Never    Passive exposure: Past (as a child)   Smokeless tobacco: Never  Vaping Use   Vaping Use: Never used  Substance and Sexual Activity   Alcohol use: Yes    Alcohol/week: 4.0 standard drinks    Types: 1 Glasses of wine, 1 Shots of liquor, 2 Standard drinks or equivalent per week   Drug use: Never   Sexual activity: Yes    Birth control/protection: Surgical  Other Topics Concern   Not on file  Social History Narrative   Not on file   Social Determinants of  Health   Financial Resource Strain: Not on file  Food Insecurity: Not on file  Transportation Needs: Not on file  Physical Activity: Not on file  Stress: Not on file  Social Connections: Not on file  Intimate Partner Violence: Not on file    ROS Review of Systems  All other systems reviewed and are negative.  Objective:   Today's Vitals: BP (!) 145/95    Pulse 76    Resp 18    Ht 5\' 10"  (1.778 m)    Wt 267 lb 6 oz (121.3 kg)    SpO2 96%    BMI 38.36 kg/m   Physical Exam Vitals and nursing note reviewed.  Constitutional:      General: He is not in acute distress. Cardiovascular:     Rate and Rhythm: Normal rate and regular rhythm.  Pulmonary:     Effort: Pulmonary effort is normal.     Breath sounds: Normal breath sounds.  Abdominal:     Palpations: Abdomen is soft.     Tenderness: There is no abdominal tenderness.  Musculoskeletal:     Right lower leg: No edema.     Left  lower leg: No edema.  Neurological:     General: No focal deficit present.     Mental Status: He is alert and oriented to person, place, and time.    Assessment & Plan:   1. Essential hypertension Patient defers changes in agents at this time as at home readings seem to be more normalized. Will monitor with present management and patient to bring at home monitor to next ov.   - hydrochlorothiazide (HYDRODIURIL) 25 MG tablet; Take 1 tablet (25 mg total) by mouth daily.  Dispense: 90 tablet; Refill: 1 - losartan (COZAAR) 50 MG tablet; Take 1 tablet (50 mg total) by mouth daily.  Dispense: 90 tablet; Refill: 1  2. Hyperlipidemia, unspecified hyperlipidemia type Continue present management  - atorvastatin (LIPITOR) 80 MG tablet; Take 1 tablet (80 mg total) by mouth daily.  Dispense: 90 tablet; Refill: 1    Outpatient Encounter Medications as of 08/14/2021  Medication Sig   aspirin EC 81 MG tablet Take 1 tablet (81 mg total) by mouth daily.   Omega-3 Fatty Acids (FISH OIL OMEGA-3 PO) Take by mouth.    polyethylene glycol-electrolytes (NULYTELY) 420 g solution Take by mouth.   testosterone cypionate (DEPOTESTOSTERONE CYPIONATE) 200 MG/ML injection SMARTSIG:0.5 Milliliter(s) IM Once a Week   [DISCONTINUED] atorvastatin (LIPITOR) 80 MG tablet TAKE 1 TABLET BY MOUTH DAILY   atorvastatin (LIPITOR) 80 MG tablet Take 1 tablet (80 mg total) by mouth daily.   hydrochlorothiazide (HYDRODIURIL) 25 MG tablet Take 1 tablet (25 mg total) by mouth daily.   losartan (COZAAR) 50 MG tablet Take 1 tablet (50 mg total) by mouth daily.   [DISCONTINUED] hydrochlorothiazide (HYDRODIURIL) 25 MG tablet Take 1 tablet (25 mg total) by mouth daily.   [DISCONTINUED] hydrochlorothiazide (HYDRODIURIL) 25 MG tablet Take 1 tablet (25 mg total) by mouth daily.   [DISCONTINUED] losartan (COZAAR) 50 MG tablet Take 1 tablet (50 mg total) by mouth daily.   [DISCONTINUED] losartan (COZAAR) 50 MG tablet Take 1 tablet (50 mg total) by mouth daily.   No facility-administered encounter medications on file as of 08/14/2021.    Follow-up: Return in about 6 months (around 02/11/2022) for follow up.   Becky Sax, MD

## 2022-01-01 ENCOUNTER — Other Ambulatory Visit: Payer: Self-pay | Admitting: Family Medicine

## 2022-01-01 DIAGNOSIS — I10 Essential (primary) hypertension: Secondary | ICD-10-CM

## 2022-01-23 ENCOUNTER — Other Ambulatory Visit: Payer: Self-pay | Admitting: *Deleted

## 2022-01-23 NOTE — Patient Outreach (Signed)
  Care Coordination   01/23/2022 Name: Carlos [name] MRN: [id number] DOB: Feb 04, 1956   Care Coordination Outreach Attempts:  An unsuccessful telephone outreach was attempted today to offer the patient information about available care coordination services as a benefit of their health plan.   Follow Up Plan:  Additional outreach attempts will be made to offer the patient care coordination information and services.   Encounter Outcome:  No Answer  Care Coordination Interventions Activated:  No   Care Coordination Interventions:  No, not indicated    Emelia Loron RN, BSN [place] [phone] [email or web address]

## 2022-02-13 ENCOUNTER — Encounter: Payer: Self-pay | Admitting: Family Medicine

## 2022-02-13 ENCOUNTER — Ambulatory Visit (INDEPENDENT_AMBULATORY_CARE_PROVIDER_SITE_OTHER): Payer: PPO | Admitting: Family Medicine

## 2022-02-13 VITALS — BP 148/92 | HR 86 | Temp 97.7°F | Resp 16 | Ht 72.0 in | Wt 270.4 lb

## 2022-02-13 DIAGNOSIS — I10 Essential (primary) hypertension: Secondary | ICD-10-CM

## 2022-02-13 DIAGNOSIS — E785 Hyperlipidemia, unspecified: Secondary | ICD-10-CM | POA: Diagnosis not present

## 2022-02-13 MED ORDER — ATORVASTATIN CALCIUM 80 MG PO TABS: 80.0000 mg | ORAL_TABLET | Freq: Every day | ORAL | 1 refills | Status: DC

## 2022-02-13 MED ORDER — HYDROCHLOROTHIAZIDE 25 MG PO TABS: 25.0000 mg | ORAL_TABLET | Freq: Every day | ORAL | 1 refills | Status: DC

## 2022-02-13 MED ORDER — LOSARTAN POTASSIUM 50 MG PO TABS: 50.0000 mg | ORAL_TABLET | Freq: Every day | ORAL | 1 refills | Status: DC

## 2022-02-15 ENCOUNTER — Encounter: Payer: Self-pay | Admitting: Family Medicine

## 2022-02-15 NOTE — Progress Notes (Signed)
Established Patient Office Visit  Subjective    Patient ID: Carlos [name], male    DOB: 10/17/1955  Age: 66 y.o. MRN: [id number]  CC:  Chief Complaint  Patient presents with   Follow-up   Hypertension    HPI CARDELL RACHEL presents for routine follow up of hypertension. Patient denies acute complaints.    Outpatient Encounter Medications as of 02/13/2022  Medication Sig   atorvastatin (LIPITOR) 80 MG tablet Take 1 tablet (80 mg total) by mouth daily.   hydrochlorothiazide (HYDRODIURIL) 25 MG tablet Take 1 tablet (25 mg total) by mouth daily.   losartan (COZAAR) 50 MG tablet Take 1 tablet (50 mg total) by mouth daily.   Omega-3 Fatty Acids (FISH OIL OMEGA-3 PO) Take by mouth.   polyethylene glycol-electrolytes (NULYTELY) 420 g solution Take by mouth.   sildenafil (VIAGRA) 100 MG tablet Take 100 mg by mouth daily.   testosterone cypionate (DEPOTESTOSTERONE CYPIONATE) 200 MG/ML injection SMARTSIG:0.5 Milliliter(s) IM Once a Week   [DISCONTINUED] aspirin EC 81 MG tablet Take 1 tablet (81 mg total) by mouth daily.   [DISCONTINUED] atorvastatin (LIPITOR) 80 MG tablet Take 1 tablet (80 mg total) by mouth daily.   [DISCONTINUED] hydrochlorothiazide (HYDRODIURIL) 25 MG tablet Take 1 tablet (25 mg total) by mouth daily.   [DISCONTINUED] losartan (COZAAR) 50 MG tablet TAKE 1 TABLET BY MOUTH DAILY   No facility-administered encounter medications on file as of 02/13/2022.    Past Medical History:  Diagnosis Date   Hyperlipidemia    Hypertension    Low testosterone    Pre-diabetes 06/22/2018   03/15/21- pt states not prediabetic as of now    Past Surgical History:  Procedure Laterality Date   COLONOSCOPY  06/20/2017   COLONOSCOPY WITH PROPOFOL  03/29/2021   MENISCUS REPAIR     ORIF TIBIA PLATEAU Left 11/09/2015   Procedure: OPEN REDUCTION INTERNAL (ORIF) FIXATION LEFT LATERAL TIBIAL PLATEAU;  Surgeon: Justice Britain, MD;  Location: [place];  Service: Orthopedics;  Laterality: Left;    ROTATOR CUFF REPAIR Right 2002   VASECTOMY      Family History  Problem Relation Age of Onset   Cancer Mother        "male"   Lung cancer Father    Bladder Cancer Sister    Colon cancer Neg Hx    Colon polyps Neg Hx    Esophageal cancer Neg Hx    Rectal cancer Neg Hx    Stomach cancer Neg Hx     Social History   Socioeconomic History   Marital status: Married    Spouse name: Not on file   Number of children: Not on file   Years of education: Not on file   Highest education level: Not on file  Occupational History   Not on file  Tobacco Use   Smoking status: Never    Passive exposure: Past (as a child)   Smokeless tobacco: Never  Vaping Use   Vaping Use: Never used  Substance and Sexual Activity   Alcohol use: Yes    Alcohol/week: 4.0 standard drinks of alcohol    Types: 1 Glasses of wine, 1 Shots of liquor, 2 Standard drinks or equivalent per week   Drug use: Never   Sexual activity: Yes    Birth control/protection: Surgical  Other Topics Concern   Not on file  Social History Narrative   Not on file   Social Determinants of Health   Financial Resource Strain: Not on file  Food Insecurity: Not on file  Transportation Needs: Not on file  Physical Activity: Not on file  Stress: Not on file  Social Connections: Not on file  Intimate Partner Violence: Not on file    Review of Systems  All other systems reviewed and are negative.       Objective    BP (!) 148/92   Pulse 86   Temp 97.7 F (36.5 C) (Oral)   Resp 16   Ht 6' (1.829 m)   Wt 270 lb 6.4 oz (122.7 kg)   SpO2 95%   BMI 36.67 kg/m   Physical Exam Vitals and nursing note reviewed.  Constitutional:      General: He is not in acute distress. Cardiovascular:     Rate and Rhythm: Normal rate and regular rhythm.  Pulmonary:     Effort: Pulmonary effort is normal.     Breath sounds: Normal breath sounds.  Abdominal:     Palpations: Abdomen is soft.     Tenderness: There is no  abdominal tenderness.  Musculoskeletal:     Right lower leg: No edema.     Left lower leg: No edema.  Neurological:     General: No focal deficit present.     Mental Status: He is alert and oriented to person, place, and time.         Assessment & Plan:   1. Essential hypertension Slightly elevated readings. Will continue and monitor. Meds refilled.  - losartan (COZAAR) 50 MG tablet; Take 1 tablet (50 mg total) by mouth daily.  Dispense: 90 tablet; Refill: 1 - hydrochlorothiazide (HYDRODIURIL) 25 MG tablet; Take 1 tablet (25 mg total) by mouth daily.  Dispense: 90 tablet; Refill: 1  2. Hyperlipidemia, unspecified hyperlipidemia type Continue. Meds refilled.  - atorvastatin (LIPITOR) 80 MG tablet; Take 1 tablet (80 mg total) by mouth daily.  Dispense: 90 tablet; Refill: 1    Return in about 6 months (around 08/16/2022) for follow up , physical.   Becky Sax, MD

## 2022-05-09 ENCOUNTER — Telehealth: Payer: Self-pay | Admitting: *Deleted

## 2022-05-09 NOTE — Telephone Encounter (Signed)
Patent called and explained that his AWV  will be a telephone call  by Awv nurse. Patient was agreeable with that.  Copied from [place] [id number] [phone]. Topic: Medicare AWV >> May 08, 2022  2:01 PM Leilani Able wrote: Reason for CRM: Pt states he thought this visit was a televisit, he states that he is not going to be in town and wants to [place]. Pt thought appt was an appt with his doctor. Pls fu to cancel  or resch [phone]

## 2022-05-10 ENCOUNTER — Ambulatory Visit (INDEPENDENT_AMBULATORY_CARE_PROVIDER_SITE_OTHER): Payer: PPO

## 2022-05-10 VITALS — Ht 72.0 in | Wt 275.0 lb

## 2022-05-10 DIAGNOSIS — Z0000 Encounter for general adult medical examination without abnormal findings: Secondary | ICD-10-CM | POA: Diagnosis not present

## 2022-05-10 NOTE — Progress Notes (Signed)
I connected with  Carlos [name] on 05/10/22 by a audio enabled telemedicine application and verified that I am speaking with the correct person using two identifiers.  Patient Location: Home  Provider Location: Office/Clinic  I discussed the limitations of evaluation and management by telemedicine. The patient expressed understanding and agreed to proceed.  Subjective:   Carlos [name] is a 66 y.o. male who presents for an Initial Medicare Annual Wellness Visit.      Objective:    Today's Vitals   There is no height or weight on file to calculate BMI.     12/01/2020    3:47 PM  Advanced Directives  Would patient like information on creating a medical advance directive? No - Patient declined    Current Medications (verified) Outpatient Encounter Medications as of 05/10/2022  Medication Sig   atorvastatin (LIPITOR) 80 MG tablet Take 1 tablet (80 mg total) by mouth daily.   hydrochlorothiazide (HYDRODIURIL) 25 MG tablet Take 1 tablet (25 mg total) by mouth daily.   losartan (COZAAR) 50 MG tablet Take 1 tablet (50 mg total) by mouth daily.   Omega-3 Fatty Acids (FISH OIL OMEGA-3 PO) Take by mouth.   polyethylene glycol-electrolytes (NULYTELY) 420 g solution Take by mouth.   sildenafil (VIAGRA) 100 MG tablet Take 100 mg by mouth daily.   testosterone cypionate (DEPOTESTOSTERONE CYPIONATE) 200 MG/ML injection SMARTSIG:0.5 Milliliter(s) IM Once a Week   No facility-administered encounter medications on file as of 05/10/2022.    Allergies (verified) Sulfa antibiotics   History: Past Medical History:  Diagnosis Date   Hyperlipidemia    Hypertension    Low testosterone    Pre-diabetes 06/22/2018   03/15/21- pt states not prediabetic as of now   Past Surgical History:  Procedure Laterality Date   COLONOSCOPY  06/20/2017   COLONOSCOPY WITH PROPOFOL  03/29/2021   MENISCUS REPAIR     ORIF TIBIA PLATEAU Left 11/09/2015   Procedure: OPEN REDUCTION INTERNAL (ORIF) FIXATION LEFT  LATERAL TIBIAL PLATEAU;  Surgeon: Justice Britain, MD;  Location: [facility];  Service: Orthopedics;  Laterality: Left;   ROTATOR CUFF REPAIR Right 2002   VASECTOMY     Family History  Problem Relation Age of Onset   Cancer Mother        "male"   Lung cancer Father    Bladder Cancer Sister    Colon cancer Neg Hx    Colon polyps Neg Hx    Esophageal cancer Neg Hx    Rectal cancer Neg Hx    Stomach cancer Neg Hx    Social History   Socioeconomic History   Marital status: Married    Spouse name: Not on file   Number of children: Not on file   Years of education: Not on file   Highest education level: Not on file  Occupational History   Not on file  Tobacco Use   Smoking status: Never    Passive exposure: Past (as a child)   Smokeless tobacco: Never  Vaping Use   Vaping Use: Never used  Substance and Sexual Activity   Alcohol use: Yes    Alcohol/week: 4.0 standard drinks of alcohol    Types: 1 Glasses of wine, 1 Shots of liquor, 2 Standard drinks or equivalent per week   Drug use: Never   Sexual activity: Yes    Birth control/protection: Surgical  Other Topics Concern   Not on file  Social History Narrative   Not on file   Social Determinants of  Health   Financial Resource Strain: Low Risk  (05/10/2022)   Overall Financial Resource Strain (CARDIA)    Difficulty of Paying Living Expenses: Not very hard  Food Insecurity: No Food Insecurity (05/10/2022)   Hunger Vital Sign    Worried About Running Out of Food in the Last Year: Never true    Ran Out of Food in the Last Year: Never true  Transportation Needs: No Transportation Needs (05/10/2022)   PRAPARE - Hydrologist (Medical): No    Lack of Transportation (Non-Medical): No  Physical Activity: Sufficiently Active (05/10/2022)   Exercise Vital Sign    Days of Exercise per Week: 5 days    Minutes of Exercise per Session: 60 min  Stress: No Stress Concern Present (05/10/2022)   Carlos [name]    Feeling of Stress : Not at all  Social Connections: [place] (05/10/2022)   Social Connection and Isolation Panel [NHANES]    Frequency of Communication with Friends and Family: Three times a week    Frequency of Social Gatherings with Friends and Family: Twice a week    Attends Religious Services: 1 to 4 times per year    Active Member of [employer] or Organizations: Yes    Attends Archivist Meetings: 1 to 4 times per year    Marital Status: Married    Tobacco Counseling Counseling given: Not Answered   Clinical Intake:  Pre-visit preparation completed: Yes  Pain : No/denies pain     Nutritional Risks: None  How often do you need to have someone help you when you read instructions, pamphlets, or other written materials from your doctor or pharmacy?: 2 - Rarely  Diabetic?no  Interpreter Needed?: No  Information entered by :: [employer] Daily [employer]    05/06/2022    8:29 PM  In your present state of health, do you have any difficulty performing the following activities:  Hearing? 0  Vision? 0  Difficulty concentrating or making decisions? 0  Walking or climbing stairs? 0  Dressing or bathing? 0  Doing errands, shopping? 0  Preparing Food and eating ? N  Using the Toilet? N  In the past six months, have you accidently leaked urine? Y  Do you have problems with loss of bowel control? N  Managing your Medications? N  Managing your Finances? N  Housekeeping or managing your Housekeeping? N    Patient Care Team: Dorna Mai, MD as PCP - General (Family Medicine)  Indicate any recent Medical Services you may have received from other than [facility] providers in the past year (date may be approximate).     Assessment:   This is a routine wellness examination for Carlos [name].  Hearing/Vision screen No results found.  Dietary issues and exercise activities  discussed:     Goals Addressed   None   Depression Screen    05/10/2022   11:09 AM 02/13/2022    3:19 PM 05/14/2021   10:20 AM 11/15/2020    8:59 AM 07/16/2019    8:45 AM 06/22/2018    4:24 PM 06/04/2017    3:38 PM  PHQ 2/9 Scores  PHQ - 2 Score 0 0 0 0 0 0 0  PHQ- 9 Score  0  0  1 0    Fall Risk    05/06/2022    8:29 PM 02/13/2022    3:25 PM 11/15/2020    8:58 AM 07/16/2019  8:45 AM  Fall Risk   Falls in the past year? 0 0 0 0  Number falls in past yr: 0  0 0  Injury with Fall? 0 0 0 0  Risk for fall due to :   No Fall Risks   Follow up   Falls evaluation completed     FALL RISK PREVENTION PERTAINING TO THE HOME:  Any stairs in or around the home? Yes  If so, are there any without handrails? Yes  Home free of loose throw rugs in walkways, pet beds, electrical cords, etc? Yes  Adequate lighting in your home to reduce risk of falls? Yes   ASSISTIVE DEVICES UTILIZED TO PREVENT FALLS:  Life alert? No  Use of a cane, walker or w/c? No  Grab bars in the bathroom? Yes  Shower chair or bench in shower? No  Elevated toilet seat or a handicapped toilet? No    Cognitive Function:        05/10/2022   11:14 AM  6CIT Screen  What Year? 0 points  What month? 0 points  What time? 0 points  Count back from 20 0 points  Months in reverse 0 points  Repeat phrase 0 points  Total Score 0 points    Immunizations Immunization History  Administered Date(s) Administered   Janssen (J&J) SARS-COV-2 Vaccination 11/17/2019   Tdap 06/24/2014    TDAP status: Up to date  Flu Vaccine status: Declined, Education has been provided regarding the importance of this vaccine but patient still declined. Advised may receive this vaccine at local pharmacy or Health Dept. Aware to provide a copy of the vaccination record if obtained from local pharmacy or Health Dept. Verbalized acceptance and understanding.  Pneumococcal vaccine status: Due, Education has been provided regarding the  importance of this vaccine. Advised may receive this vaccine at local pharmacy or Health Dept. Aware to provide a copy of the vaccination record if obtained from local pharmacy or Health Dept. Verbalized acceptance and understanding.  Covid-19 vaccine status: Completed vaccines  Qualifies for Shingles Vaccine? Yes   Zostavax completed No   Shingrix Completed?: No.    Education has been provided regarding the importance of this vaccine. Patient has been advised to call insurance company to determine out of pocket expense if they have not yet received this vaccine. Advised may also receive vaccine at local pharmacy or Health Dept. Verbalized acceptance and understanding.  Screening Tests Health Maintenance  Topic Date Due   COVID-19 Vaccine (2 - Booster for Janssen series) 01/12/2020   INFLUENZA VACCINE  Never done   Zoster Vaccines- Shingrix (1 of 2) 05/16/2022 (Originally 06/25/2004)   Pneumonia Vaccine 31+ Years old (1 - PCV) 02/14/2023 (Originally 06/26/2019)   Medicare Annual Wellness (AWV)  05/11/2023   COLONOSCOPY (Pts 45-29yr Insurance coverage will need to be confirmed)  03/29/2026   Hepatitis C Screening  Completed   HPV VACCINES  Aged Out    Health Maintenance  Health Maintenance Due  Topic Date Due   COVID-19 Vaccine (2 - Booster for Janssen series) 01/12/2020   INFLUENZA VACCINE  Never done    Colorectal cancer screening: Type of screening: Colonoscopy. Completed 03/29/21. Repeat every 5 years  Lung Cancer Screening: (Low Dose CT Chest recommended if Age 67-80years, 30 pack-year currently smoking OR have quit w/in 15years.) does not qualify.   Lung Cancer Screening Referral: no  Additional Screening:  Hepatitis C Screening: does qualify; Completed 07/16/19  Vision Screening: Recommended annual ophthalmology exams for early  detection of glaucoma and other disorders of the eye. Is the patient up to date with their annual eye exam?  Yes  Who is the provider or what is  the name of the office in which the patient attends annual eye exams? Dr.Roberts If pt is not established with a provider, would they like to be referred to a provider to establish care? No .   Dental Screening: Recommended annual dental exams for proper oral hygiene  Community Resource Referral / Chronic Care Management: CRR required this visit?  No   CCM required this visit?  No      Plan:     I have personally reviewed and noted the following in the patient's chart:   Medical and social history Use of alcohol, tobacco or illicit drugs  Current medications and supplements including opioid prescriptions. Patient is not currently taking opioid prescriptions. Functional ability and status Nutritional status Physical activity Advanced directives List of other physicians Hospitalizations, surgeries, and ER visits in previous 12 months Vitals Screenings to include cognitive, depression, and falls Referrals and appointments  In addition, I have reviewed and discussed with patient certain preventive protocols, quality metrics, and best practice recommendations. A written personalized care plan for preventive services as well as general preventive health recommendations were provided to patient.     [place], CMA   05/10/2022   Nurse Notes:

## 2022-08-14 ENCOUNTER — Ambulatory Visit (INDEPENDENT_AMBULATORY_CARE_PROVIDER_SITE_OTHER): Payer: PPO | Admitting: Family Medicine

## 2022-08-14 ENCOUNTER — Ambulatory Visit: Payer: PPO | Admitting: Family Medicine

## 2022-08-14 ENCOUNTER — Ambulatory Visit (INDEPENDENT_AMBULATORY_CARE_PROVIDER_SITE_OTHER): Payer: PPO

## 2022-08-14 ENCOUNTER — Encounter: Payer: Self-pay | Admitting: Family Medicine

## 2022-08-14 VITALS — BP 123/91 | HR 78 | Temp 98.1°F | Resp 16 | Ht 73.0 in | Wt 276.6 lb

## 2022-08-14 DIAGNOSIS — E785 Hyperlipidemia, unspecified: Secondary | ICD-10-CM

## 2022-08-14 DIAGNOSIS — M545 Low back pain, unspecified: Secondary | ICD-10-CM

## 2022-08-14 DIAGNOSIS — G8929 Other chronic pain: Secondary | ICD-10-CM

## 2022-08-14 DIAGNOSIS — I10 Essential (primary) hypertension: Secondary | ICD-10-CM | POA: Diagnosis not present

## 2022-08-14 MED ORDER — LOSARTAN POTASSIUM 50 MG PO TABS: 50.0000 mg | ORAL_TABLET | Freq: Every day | ORAL | 1 refills | Status: AC

## 2022-08-14 MED ORDER — ATORVASTATIN CALCIUM 80 MG PO TABS: 80.0000 mg | ORAL_TABLET | Freq: Every day | ORAL | 1 refills | Status: DC

## 2022-08-15 ENCOUNTER — Encounter: Payer: Self-pay | Admitting: Family Medicine

## 2022-08-15 NOTE — Progress Notes (Signed)
Established Patient Office Visit  Subjective    Patient ID: Carlos [name], male    DOB: 24-Sep-1955  Age: 67 y.o. MRN: [id number]  CC: No chief complaint on file.   HPI [name] presents to establish care for routine follow up of chronic med issues. Patient denies acute complaints or concerns.    Outpatient Encounter Medications as of 08/14/2022  Medication Sig   Omega-3 Fatty Acids (FISH OIL OMEGA-3 PO) Take by mouth.   polyethylene glycol-electrolytes (NULYTELY) 420 g solution Take by mouth.   sildenafil (VIAGRA) 100 MG tablet Take 100 mg by mouth daily.   testosterone cypionate (DEPOTESTOSTERONE CYPIONATE) 200 MG/ML injection SMARTSIG:0.5 Milliliter(s) IM Once a Week   [DISCONTINUED] atorvastatin (LIPITOR) 80 MG tablet Take 1 tablet (80 mg total) by mouth daily.   [DISCONTINUED] losartan (COZAAR) 50 MG tablet Take 1 tablet (50 mg total) by mouth daily.   atorvastatin (LIPITOR) 80 MG tablet Take 1 tablet (80 mg total) by mouth daily.   hydrochlorothiazide (HYDRODIURIL) 25 MG tablet Take 1 tablet (25 mg total) by mouth daily.   losartan (COZAAR) 50 MG tablet Take 1 tablet (50 mg total) by mouth daily.   No facility-administered encounter medications on file as of 08/14/2022.    Past Medical History:  Diagnosis Date   Hyperlipidemia    Hypertension    Low testosterone    Pre-diabetes 06/22/2018   03/15/21- pt states not prediabetic as of now    Past Surgical History:  Procedure Laterality Date   COLONOSCOPY  06/20/2017   COLONOSCOPY WITH PROPOFOL  03/29/2021   MENISCUS REPAIR     ORIF TIBIA PLATEAU Left 11/09/2015   Procedure: OPEN REDUCTION INTERNAL (ORIF) FIXATION LEFT LATERAL TIBIAL PLATEAU;  Surgeon: Justice Britain, MD;  Location: [place];  Service: Orthopedics;  Laterality: Left;   ROTATOR CUFF REPAIR Right 2002   VASECTOMY      Family History  Problem Relation Age of Onset   Cancer Mother        "male"   Lung cancer Father    Bladder Cancer Sister    Colon  cancer Neg Hx    Colon polyps Neg Hx    Esophageal cancer Neg Hx    Rectal cancer Neg Hx    Stomach cancer Neg Hx     Social History   Socioeconomic History   Marital status: Married    Spouse name: Not on file   Number of children: Not on file   Years of education: Not on file   Highest education level: Not on file  Occupational History   Not on file  Tobacco Use   Smoking status: Never    Passive exposure: Past (as a child)   Smokeless tobacco: Never  Vaping Use   Vaping Use: Never used  Substance and Sexual Activity   Alcohol use: Yes    Alcohol/week: 4.0 standard drinks of alcohol    Types: 1 Glasses of wine, 1 Shots of liquor, 2 Standard drinks or equivalent per week   Drug use: Never   Sexual activity: Yes    Birth control/protection: Surgical  Other Topics Concern   Not on file  Social History Narrative   Not on file   Social Determinants of Health   Financial Resource Strain: Low Risk  (05/10/2022)   Overall Financial Resource Strain (CARDIA)    Difficulty of Paying Living Expenses: Not very hard  Food Insecurity: No Food Insecurity (05/10/2022)   Hunger Vital Sign  Worried About Charity fundraiser in the Last Year: Never true    [place] in the Last Year: Never true  Transportation Needs: No Transportation Needs (05/10/2022)   PRAPARE - Hydrologist (Medical): No    Lack of Transportation (Non-Medical): No  Physical Activity: Sufficiently Active (05/10/2022)   Exercise Vital Sign    Days of Exercise per Week: 5 days    Minutes of Exercise per Session: 60 min  Stress: No Stress Concern Present (05/10/2022)   [place]    Feeling of Stress : Not at all  Social Connections: [place] (05/10/2022)   Social Connection and Isolation Panel [NHANES]    Frequency of Communication with Friends and Family: Three times a week    Frequency of Social  Gatherings with Friends and Family: Twice a week    Attends Religious Services: 1 to 4 times per year    Active Member of [employer] or Organizations: Yes    Attends Archivist Meetings: 1 to 4 times per year    Marital Status: Married  Human resources officer Violence: Not At Risk (05/10/2022)   Humiliation, Afraid, Rape, and Kick questionnaire    Fear of Current or Ex-Partner: No    Emotionally Abused: No    Physically Abused: No    Sexually Abused: No    Review of Systems  All other systems reviewed and are negative.       Objective    BP (!) 123/91   Pulse 78   Temp 98.1 F (36.7 C) (Oral)   Resp 16   Ht 6' 1"$  (1.854 m)   Wt 276 lb 9.6 oz (125.5 kg)   SpO2 96%   BMI 36.49 kg/m   Physical Exam Vitals and nursing note reviewed.  Constitutional:      General: He is not in acute distress. Cardiovascular:     Rate and Rhythm: Normal rate and regular rhythm.  Pulmonary:     Effort: Pulmonary effort is normal.     Breath sounds: Normal breath sounds.  Abdominal:     Palpations: Abdomen is soft.     Tenderness: There is no abdominal tenderness.  Musculoskeletal:     Right lower leg: No edema.     Left lower leg: No edema.  Neurological:     General: No focal deficit present.     Mental Status: He is alert and oriented to person, place, and time.         Assessment & Plan:   1. Essential hypertension Appears stable. Continue and monitor - losartan (COZAAR) 50 MG tablet; Take 1 tablet (50 mg total) by mouth daily.  Dispense: 90 tablet; Refill: 1  2. Hyperlipidemia, unspecified hyperlipidemia type Continue - atorvastatin (LIPITOR) 80 MG tablet; Take 1 tablet (80 mg total) by mouth daily.  Dispense: 90 tablet; Refill: 1  3. Chronic right-sided low back pain without sciatica  - DG Lumbar Spine Complete; Future    Return in about 6 months (around 02/12/2023) for follow up.   Becky Sax, MD

## 2022-08-22 ENCOUNTER — Other Ambulatory Visit: Payer: Self-pay | Admitting: Family Medicine

## 2022-08-22 DIAGNOSIS — I10 Essential (primary) hypertension: Secondary | ICD-10-CM

## 2022-08-30 ENCOUNTER — Telehealth: Payer: Self-pay | Admitting: *Deleted

## 2022-08-30 NOTE — Telephone Encounter (Signed)
Copied from [place] [id number]. Topic: General - Other >> Aug 28, 2022 10:03 AM Leone Payor F wrote: Reason for CRM: Patient is calling in because he had an X-ray done on his back in February. Patient says he was told the results of his X-rays, but he wasn't given any further instructions. Patient wants to know what are the next steps because his back pain still persists. Please advise.

## 2022-09-02 ENCOUNTER — Other Ambulatory Visit: Payer: Self-pay | Admitting: Family Medicine

## 2022-09-02 DIAGNOSIS — M545 Low back pain, unspecified: Secondary | ICD-10-CM

## 2022-09-02 NOTE — Telephone Encounter (Signed)
Patient called and aware of provider msg

## 2022-09-05 ENCOUNTER — Encounter: Payer: Self-pay | Admitting: Physical Medicine and Rehabilitation

## 2022-09-05 ENCOUNTER — Ambulatory Visit: Payer: PPO | Admitting: Physical Medicine and Rehabilitation

## 2022-09-05 DIAGNOSIS — M47816 Spondylosis without myelopathy or radiculopathy, lumbar region: Secondary | ICD-10-CM | POA: Diagnosis not present

## 2022-09-05 DIAGNOSIS — G8929 Other chronic pain: Secondary | ICD-10-CM

## 2022-09-05 DIAGNOSIS — M545 Low back pain, unspecified: Secondary | ICD-10-CM | POA: Diagnosis not present

## 2022-09-05 NOTE — Progress Notes (Signed)
Functional Pain Scale - descriptive words and definitions  Moderate (4)   Constantly aware of pain, can complete ADLs with modification/sleep marginally affected at times/passive distraction is of no use, but active distraction gives some relief. Moderate range order  Average Pain  varies  Lower back pain on both sides

## 2022-09-05 NOTE — Progress Notes (Signed)
Carlos [name] - 67 y.o. male MRN [id number]  Date of birth: [birth year]  Office Visit Note: Visit Date: 09/05/2022 PCP: Dorna Mai, MD Referred by: Dorna Mai, MD  Subjective: Chief Complaint  Patient presents with   Lower Back - Pain   HPI: Carlos [name] is a 67 y.o. male who comes in today per the request of Dr. Dorna Mai for evaluation of chronic, worsening and severe bilateral lower back pain, right greater than left. Pain ongoing for several years, increased over the last couple of month. His pain worsens with standing, prolonged sitting and when moving from sitting to standing position. 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 He describes pain as sore and aching, currently rates as 7 out of 10. Some relief of pain with home exercise regimen, rest and use of medications. His son is Product/process development scientist and has performed dry needling on his lower back with some relief of pain. Recent lumbar x-rays exhibits lower lumbar facet arthropathy, possible irregularity of superior endplate of L3, mild disc height loss of L3, could be suggestive of acute vs chronic compression fracture. No history of lumbar surgery/lumbar injections. History of left lateral tibial plateau fracture from a fall, repaired Dr. Justice Britain in 2017. Patient states he is fairly active, does walk frequently for about 45 minutes at a time. Patient denies focal weakness, numbness and tingling. No recent trauma or falls.    Oswestry Disability Index Score 16% 0 to 10 (20%)  minimal disability: The patient can cope with most living activities. Usually no treatment is indicated apart from advice on lifting sitting and exercise.  Review of Systems  Musculoskeletal:  Positive for back pain.  Neurological:  Negative for tingling, sensory change, focal weakness and weakness.  All other systems reviewed and are negative.  Otherwise per HPI.  Assessment & Plan: Visit  Diagnoses:    ICD-10-CM   1. Spondylosis without myelopathy or radiculopathy, lumbar region  M47.816 MR LUMBAR SPINE WO CONTRAST    2. Chronic bilateral low back pain without sciatica  M54.50 MR LUMBAR SPINE WO CONTRAST   G89.29     3. Facet arthropathy, lumbar  M47.816 MR LUMBAR SPINE WO CONTRAST       Plan: Findings:  Chronic, worsening and severe bilateral lower back pain, right greater than left. Patient continues to have severe pain despite good conservative therapies such as home exercise regimen, rest and use of medications. Patients clinical presentation and exam are complex, differentials could include facet mediated pain vs neurogenic claudication from spinal canal stenosis. He does experience pain with prolonged standing. Next step is to obtain lumbar MRI imaging. Depending on MRI imaging we did discuss possibility of lumbar epidural steroid injection. Could also look at facet joint injections if we feel his pain is more facet mediated. I did discuss injection procedure with him in detail today, he did voice anxiety related to needles. I am happy to provide him with pre-procedure Valium before injection. I do think patient would benefit from formal physical therapy with a focus on manual treatments, core strengthening and posture. He is not interested in starting PT at this time, we can always revisit. We will have patient follow up for lumbar MRI review and to discuss treatment options. No red flag symptoms noted upon exam today.     Meds & Orders: No orders of the defined types were placed in this encounter.   Orders Placed  This Encounter  Procedures   MR LUMBAR SPINE WO CONTRAST    Follow-up: Return for follow up for lumbar MRI review.   Procedures: No procedures performed      Clinical History: EXAM: LUMBAR SPINE - COMPLETE 4+ VIEW   COMPARISON:  None Available.   FINDINGS: Endplate irregularity about the superior endplate of L3 is presumed chronic though acute  fracture is difficult to exclude given lack of comparison. Multilevel spondylosis with bridging anterior osteophytes at L1-L3. Mild-moderate disc space height loss at L2-L3. Lower lumbar facet arthropathy.   IMPRESSION: Endplate irregularity about the superior endplate of L3 with mild height loss of L3 is favored due to chronic degenerative endplate change though compression fracture is difficult to exclude.     Electronically Signed   By: Placido Sou M.D.   On: 08/16/2022 23:31   He reports that he has never smoked. He has been exposed to tobacco smoke. He has never used smokeless tobacco. No results for input(s): "HGBA1C", "LABURIC" in the last 8760 hours.  Objective:  VS:  HT:    WT:   BMI:     BP:   HR: bpm  TEMP: ( )  RESP:  Physical Exam Vitals and nursing note reviewed.  HENT:     Head: Normocephalic and atraumatic.     Right Ear: External ear normal.     Left Ear: External ear normal.     Nose: Nose normal.     Mouth/Throat:     Mouth: Mucous membranes are moist.  Eyes:     Pupils: Pupils are equal, round, and reactive to light.  Cardiovascular:     Rate and Rhythm: Normal rate.     Pulses: Normal pulses.  Pulmonary:     Effort: Pulmonary effort is normal.  Abdominal:     General: Abdomen is flat. There is no distension.  Musculoskeletal:        General: Tenderness present.     Cervical back: Normal range of motion.     Comments: Patient rises from seated position to standing without difficulty. Concordant low back pain with facet loading, lumbar spine extension and rotation. 5/5 strength noted with bilateral hip flexion, knee flexion/extension, ankle dorsiflexion/plantarflexion and EHL. No clonus noted bilaterally. No pain upon palpation of greater trochanters. No pain with internal/external rotation of bilateral hips. Sensation intact bilaterally. Ambulates without aid, gait steady.        Skin:    General: Skin is warm and dry.     Capillary Refill:  Capillary refill takes less than 2 seconds.  Neurological:     General: No focal deficit present.     Mental Status: He is alert and oriented to person, place, and time.  Psychiatric:        Mood and Affect: Mood normal.        Behavior: Behavior normal.     Ortho Exam  Imaging: No results found.  Past Medical/Family/Surgical/Social History: Medications & Allergies reviewed per EMR, new medications updated. Patient Active Problem List   Diagnosis Date Noted   Hyperlipidemia 07/20/2019   Elevated PSA 06/04/2017   Hypogonadism in male 09/15/2007   Essential hypertension 09/15/2007   DIVERTICULITIS OF COLON 09/15/2007   Past Medical History:  Diagnosis Date   Hyperlipidemia    Hypertension    Low testosterone    Pre-diabetes 06/22/2018   03/15/21- pt states not prediabetic as of now   Family History  Problem Relation Age of Onset   Cancer Mother        "  male"   Lung cancer Father    Bladder Cancer Sister    Colon cancer Neg Hx    Colon polyps Neg Hx    Esophageal cancer Neg Hx    Rectal cancer Neg Hx    Stomach cancer Neg Hx    Past Surgical History:  Procedure Laterality Date   COLONOSCOPY  06/20/2017   COLONOSCOPY WITH PROPOFOL  03/29/2021   MENISCUS REPAIR     ORIF TIBIA PLATEAU Left 11/09/2015   Procedure: OPEN REDUCTION INTERNAL (ORIF) FIXATION LEFT LATERAL TIBIAL PLATEAU;  Surgeon: Justice Britain, MD;  Location: [facility];  Service: Orthopedics;  Laterality: Left;   ROTATOR CUFF REPAIR Right 2002   VASECTOMY     Social History   Occupational History   Not on file  Tobacco Use   Smoking status: Never    Passive exposure: Past (as a child)   Smokeless tobacco: Never  Vaping Use   Vaping Use: Never used  Substance and Sexual Activity   Alcohol use: Yes    Alcohol/week: 4.0 standard drinks of alcohol    Types: 1 Glasses of wine, 1 Shots of liquor, 2 Standard drinks or equivalent per week   Drug use: Never   Sexual activity: Yes    Birth  control/protection: Surgical

## 2022-09-09 ENCOUNTER — Encounter: Payer: Self-pay | Admitting: Physical Medicine and Rehabilitation

## 2022-09-18 ENCOUNTER — Ambulatory Visit
Admission: RE | Admit: 2022-09-18 | Discharge: 2022-09-18 | Disposition: A | Discharge status: Home / Self Care | Payer: PPO | Source: Ambulatory Visit | Attending: Physical Medicine and Rehabilitation | Admitting: Physical Medicine and Rehabilitation

## 2022-09-18 DIAGNOSIS — M47816 Spondylosis without myelopathy or radiculopathy, lumbar region: Secondary | ICD-10-CM

## 2022-09-18 DIAGNOSIS — G8929 Other chronic pain: Secondary | ICD-10-CM

## 2022-09-24 ENCOUNTER — Ambulatory Visit: Payer: PPO | Admitting: Physical Medicine and Rehabilitation

## 2022-09-24 DIAGNOSIS — M47816 Spondylosis without myelopathy or radiculopathy, lumbar region: Secondary | ICD-10-CM

## 2022-09-24 DIAGNOSIS — G8929 Other chronic pain: Secondary | ICD-10-CM | POA: Diagnosis not present

## 2022-09-24 DIAGNOSIS — M545 Low back pain, unspecified: Secondary | ICD-10-CM | POA: Diagnosis not present

## 2022-09-24 NOTE — Progress Notes (Signed)
Carlos [name] - 67 y.o. male MRN [id number]  Date of birth: 1955/12/12  Office Visit Note: Visit Date: 09/24/2022 PCP: Dorna Mai, MD Referred by: Dorna Mai, MD  Subjective: Chief Complaint  Patient presents with   Lower Back - Pain   HPI: MERON BOSTROM is a 67 y.o. male who comes in today for evaluation of chronic, worsening and severe bilateral lower back pain, right greater than left. Pain ongoing for several years, worsened over the last several months. No radicular symptoms down the legs. Pain worsens with standing and when moving from sitting to standing position. Pain is especially bad when standing to fish. He describes pain as sore and aching, currently rates as 7 out of 10. Some relief of pain with home exercise regimen, rest and use of medications. Recent lumbar MRI imaging exhibits chronic compression deformity involving the L3 superior endplate, multi level facet changes, most severe at L3-L4, L4-L5 and L5-S1. No high grade spinal stenosis. Patient continues to be active, does walk multiple times a week. Patient denies focal weakness, numbness and tingling. No recent trauma or falls.       Review of Systems  Musculoskeletal:  Positive for back pain.  Neurological:  Negative for tingling, sensory change, focal weakness and weakness.  All other systems reviewed and are negative.  Otherwise per HPI.  Assessment & Plan: Visit Diagnoses:    ICD-10-CM   1. Chronic bilateral low back pain without sciatica  M54.50 Ambulatory referral to Physical Therapy   G89.29     2. Spondylosis without myelopathy or radiculopathy, lumbar region  M47.816 Ambulatory referral to Physical Therapy    3. Facet arthropathy, lumbar  M47.816 Ambulatory referral to Physical Therapy       Plan: Findings:  Chronic, worsening and severe bilateral lower back pain, right greater than left. No radicular symptoms. Patient continues to have severe pain despite good conservative therapies such as  home exercise regimen, rest and use of medications. Patients clinical presentation and exam are consistent with facet mediated pain.  He does have pain with lumbar extension upon exam today.  Recent lumbar MRI imaging does show multilevel facet arthropathy.  We discussed treatment plan in detail today, next step is to place referral for regimen of formal physical therapy.  I do feel he would benefit from manual treatments and core strengthening exercises.  If his pain persists after regimen of physical therapy we discussed possibility of facet joint injections. If he gets good relief of pain with facet joint injections we also discussed longer sustained relief of pain with radiofrequency ablation. I did provide patient with educational information regarding radiofrequency ablation to take home and review. He will follow up in approximately 6 weeks for re-evaluation. No red flag symptoms noted upon exam today.     Meds & Orders: No orders of the defined types were placed in this encounter.   Orders Placed This Encounter  Procedures   Ambulatory referral to Physical Therapy    Follow-up: Return for 6 week follow up for re-evaluation.   Procedures: No procedures performed      Clinical History: CLINICAL DATA:  Low back pain, symptoms persist with > 6 wks treatment. Numbness in the feet.   EXAM: MRI LUMBAR SPINE WITHOUT CONTRAST   TECHNIQUE: Multiplanar, multisequence MR imaging of the lumbar spine was performed. No intravenous contrast was administered.   COMPARISON:  Lumbar spine radiographs 08/14/2022   FINDINGS: Segmentation:  Standard.   Alignment:  Trace retrolisthesis of L2  on L3.   Vertebrae: Prominent Schmorl's node/chronic compression deformity involving the L3 superior endplate. Heterogeneous bone marrow signal diffusely with patchy fatty marrow changes and hemangiomas. No suspicious marrow lesion. Mild right facet edema at L4-5.   Conus medullaris and cauda equina: Conus  extends to the lower L1 level. Conus and cauda equina appear normal.   Paraspinal and other soft tissues: Unremarkable.   Disc levels:   T12-L1: Negative.   L1-2: Disc desiccation. Tiny central disc protrusion with annular fissure. No stenosis.   L2-3: Disc desiccation and moderate disc space narrowing. Disc bulging without stenosis.   L3-4: Disc desiccation and mild disc space narrowing. Disc bulging and mild facet and ligamentum flavum hypertrophy result in mild bilateral neural foraminal stenosis without spinal stenosis.   L4-5: Mild disc bulging and mild facet hypertrophy result in minimal bilateral neural foraminal narrowing without spinal stenosis.   L5-S1: Mild facet hypertrophy without stenosis.   IMPRESSION: 1. Multilevel lumbar disc and facet degeneration without spinal stenosis. 2. Mild bilateral neural foraminal stenosis at L3-4.     Electronically Signed   By: Logan Bores M.D.   On: 09/19/2022 16:43   He reports that he has never smoked. He has been exposed to tobacco smoke. He has never used smokeless tobacco. No results for input(s): "HGBA1C", "LABURIC" in the last 8760 hours.  Objective:  VS:  HT:    WT:   BMI:     BP:   HR: bpm  TEMP: ( )  RESP:  Physical Exam Vitals and nursing note reviewed.  HENT:     Head: Normocephalic and atraumatic.     Right Ear: External ear normal.     Left Ear: External ear normal.     Nose: Nose normal.     Mouth/Throat:     Mouth: Mucous membranes are moist.  Eyes:     Extraocular Movements: Extraocular movements intact.  Cardiovascular:     Rate and Rhythm: Normal rate.     Pulses: Normal pulses.  Pulmonary:     Effort: Pulmonary effort is normal.  Abdominal:     General: Abdomen is flat. There is no distension.  Musculoskeletal:        General: Tenderness present.     Cervical back: Normal range of motion.     Comments: Patient rises from seated position to standing without difficulty. Concordant low  back pain with facet loading, lumbar spine extension and rotation. 5/5 strength noted with bilateral hip flexion, knee flexion/extension, ankle dorsiflexion/plantarflexion and EHL. No clonus noted bilaterally. No pain upon palpation of greater trochanters. No pain with internal/external rotation of bilateral hips. Sensation intact bilaterally. Negative slump test bilaterally. Ambulates without aid, gait steady.      Skin:    General: Skin is warm and dry.     Capillary Refill: Capillary refill takes less than 2 seconds.  Neurological:     General: No focal deficit present.     Mental Status: He is alert and oriented to person, place, and time.  Psychiatric:        Mood and Affect: Mood normal.        Behavior: Behavior normal.     Ortho Exam  Imaging: No results found.  Past Medical/Family/Surgical/Social History: Medications & Allergies reviewed per EMR, new medications updated. Patient Active Problem List   Diagnosis Date Noted   Hyperlipidemia 07/20/2019   Elevated PSA 06/04/2017   Hypogonadism in male 09/15/2007   Essential hypertension 09/15/2007   DIVERTICULITIS OF COLON  09/15/2007   Past Medical History:  Diagnosis Date   Hyperlipidemia    Hypertension    Low testosterone    Pre-diabetes 06/22/2018   03/15/21- pt states not prediabetic as of now   Family History  Problem Relation Age of Onset   Cancer Mother        "male"   Lung cancer Father    Bladder Cancer Sister    Colon cancer Neg Hx    Colon polyps Neg Hx    Esophageal cancer Neg Hx    Rectal cancer Neg Hx    Stomach cancer Neg Hx    Past Surgical History:  Procedure Laterality Date   COLONOSCOPY  06/20/2017   COLONOSCOPY WITH PROPOFOL  03/29/2021   MENISCUS REPAIR     ORIF TIBIA PLATEAU Left 11/09/2015   Procedure: OPEN REDUCTION INTERNAL (ORIF) FIXATION LEFT LATERAL TIBIAL PLATEAU;  Surgeon: Justice Britain, MD;  Location: [facility];  Service: Orthopedics;  Laterality: Left;   ROTATOR CUFF REPAIR  Right 2002   VASECTOMY     Social History   Occupational History   Not on file  Tobacco Use   Smoking status: Never    Passive exposure: Past (as a child)   Smokeless tobacco: Never  Vaping Use   Vaping Use: Never used  Substance and Sexual Activity   Alcohol use: Yes    Alcohol/week: 4.0 standard drinks of alcohol    Types: 1 Glasses of wine, 1 Shots of liquor, 2 Standard drinks or equivalent per week   Drug use: Never   Sexual activity: Yes    Birth control/protection: Surgical

## 2022-09-24 NOTE — Progress Notes (Signed)
Functional Pain Scale - descriptive words and definitions  Distracting (5)    Aware of pain/able to complete some ADL's but limited by pain/sleep is affected and active distractions are only slightly useful. Moderate range order  Average Pain 5  Lower back pain. MRI review 

## 2022-09-27 ENCOUNTER — Encounter: Payer: Self-pay | Admitting: Physical Medicine and Rehabilitation

## 2022-10-02 ENCOUNTER — Encounter: Payer: Self-pay | Admitting: Physical Therapy

## 2022-10-02 ENCOUNTER — Ambulatory Visit: Payer: PPO | Admitting: Physical Therapy

## 2022-10-02 ENCOUNTER — Other Ambulatory Visit: Payer: Self-pay

## 2022-10-02 DIAGNOSIS — R2681 Unsteadiness on feet: Secondary | ICD-10-CM | POA: Diagnosis not present

## 2022-10-02 DIAGNOSIS — R293 Abnormal posture: Secondary | ICD-10-CM

## 2022-10-02 DIAGNOSIS — R29898 Other symptoms and signs involving the musculoskeletal system: Secondary | ICD-10-CM

## 2022-10-02 DIAGNOSIS — M6281 Muscle weakness (generalized): Secondary | ICD-10-CM

## 2022-10-02 DIAGNOSIS — M5459 Other low back pain: Secondary | ICD-10-CM

## 2022-10-02 NOTE — Therapy (Signed)
OUTPATIENT PHYSICAL THERAPY THORACOLUMBAR EVALUATION   Patient Name: Carlos [name] MRN: [id number] DOB:03-22-1956, 67 y.o., male Today's Date: 10/02/2022  END OF SESSION:  PT End of Session - 10/02/22 0901     Visit Number 1    Number of Visits 9    Date for PT Re-Evaluation 11/27/22    Authorization Type HTA    Authorization Time Period 10/02/22 to 11/27/22    Progress Note Due on Visit 10    PT Start Time 0847    PT Stop Time 0930    PT Time Calculation (min) 43 min    Activity Tolerance Patient tolerated treatment well    Behavior During Therapy [facility] for tasks assessed/performed             Past Medical History:  Diagnosis Date   Hyperlipidemia    Hypertension    Low testosterone    Pre-diabetes 06/22/2018   03/15/21- pt states not prediabetic as of now   Past Surgical History:  Procedure Laterality Date   COLONOSCOPY  06/20/2017   COLONOSCOPY WITH PROPOFOL  03/29/2021   MENISCUS REPAIR     ORIF TIBIA PLATEAU Left 11/09/2015   Procedure: OPEN REDUCTION INTERNAL (ORIF) FIXATION LEFT LATERAL TIBIAL PLATEAU;  Surgeon: Francena Hanly, MD;  Location: [facility] OR;  Service: Orthopedics;  Laterality: Left;   ROTATOR CUFF REPAIR Right 2002   VASECTOMY     Patient Active Problem List   Diagnosis Date Noted   Hyperlipidemia 07/20/2019   Elevated PSA 06/04/2017   Hypogonadism in male 09/15/2007   Essential hypertension 09/15/2007   DIVERTICULITIS OF COLON 09/15/2007    PCP: Georganna Skeans MD   REFERRING PROVIDER: Juanda Chance, NP  REFERRING DIAG: [phone] (ICD-10-CM) - Chronic bilateral low back pain without sciatica M47.816 (ICD-10-CM) - Spondylosis without myelopathy or radiculopathy, lumbar region M47.816 (ICD-10-CM) - Facet arthropathy, lumbar  Rationale for Evaluation and Treatment: Rehabilitation  THERAPY DIAG:  Other low back pain  Muscle weakness (generalized)  Abnormal posture  Unsteadiness on feet  Other symptoms and signs involving the  musculoskeletal system  ONSET DATE: 09/24/2022  SUBJECTIVE:                                                                                                                                                                                           SUBJECTIVE STATEMENT:  I'm a bad patient, this has been going on for about 3 years now. In 2016 I stepped off a boat and hyper-extended my L LE, broke my tibial plateau. In the same year I hurt my right knee lifting weights, tore my meniscus, had surgery but ended up  bone on bone after surgery because they took all the cartilage out of the knee. 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 Balance has gotten terrible, fell a couple times in the past 2 years. Tried wearing a back belt to help but didn't work.   PERTINENT HISTORY:  Expand All Collapse All    Carlos [name] - 67 y.o. male MRN [id number]  Date of birth: 1955-12-06   Office Visit Note: Visit Date: 09/24/2022 PCP: Georganna Skeans, MD Referred by: Georganna Skeans, MD   Subjective:    Chief Complaint  Patient presents with   Lower Back - Pain    HPI: Carlos [name] is a 67 y.o. male who comes in today for evaluation of chronic, worsening and severe bilateral lower back pain, right greater than left. Pain ongoing for several years, worsened over the last several months. No radicular symptoms down the legs. Pain worsens with standing and when moving from sitting to standing position. Pain is especially bad when standing to fish. He describes pain as sore and aching, currently rates as 7 out of 10. Some relief of pain with home exercise regimen, rest and use of medications. Recent lumbar MRI imaging exhibits chronic compression deformity involving the L3 superior endplate, multi level facet changes, most severe at L3-L4, L4-L5 and L5-S1. No high grade spinal stenosis. Patient  continues to be active, does walk multiple times a week. Patient denies focal weakness, numbness and tingling. No recent trauma or falls.          Review of Systems  Musculoskeletal:  Positive for back pain.  Neurological:  Negative for tingling, sensory change, focal weakness and weakness.  All other systems reviewed and are negative.  Otherwise per HPI.   Assessment & Plan: Visit Diagnoses:      ICD-10-CM    1. Chronic bilateral low back pain without sciatica  M54.50 Ambulatory referral to Physical Therapy    G89.29       2. Spondylosis without myelopathy or radiculopathy, lumbar region  M47.816 Ambulatory referral to Physical Therapy     3. Facet arthropathy, lumbar  M47.816 Ambulatory referral to Physical Therapy        Plan: Findings:  Chronic, worsening and severe bilateral lower back pain, right greater than left. No radicular symptoms. Patient continues to have severe pain despite good conservative therapies such as home exercise regimen, rest and use of medications. Patients clinical presentation and exam are consistent with facet mediated pain.  He does have pain with lumbar extension upon exam today.  Recent lumbar MRI imaging does show multilevel facet arthropathy.  We discussed treatment plan in detail today, next step is to place referral for regimen of formal physical therapy.  I do feel he would benefit from manual treatments and core strengthening exercises.  If his pain persists after regimen of physical therapy we discussed possibility of facet joint injections. If he gets good relief of pain with facet joint injections we also discussed longer sustained relief of pain with radiofrequency ablation. I did provide patient with educational information regarding radiofrequency ablation to take home and review. He will follow up in approximately 6 weeks for re-evaluation. No red flag symptoms noted upon exam today.       PAIN:  Are you having pain? Yes: NPRS scale: 2-3/10;  8/10 at most  Pain location: R  lumbar area  Pain description: nagging, can "bark a lot" sometimes   Aggravating factors: standing too long  Relieving factors: "sitting in recliner with feet up and letting everything sag"   PRECAUTIONS: None  WEIGHT BEARING RESTRICTIONS: No  FALLS:  Has patient fallen in last 6 months? No 2 falls in the past 2 years   LIVING ENVIRONMENT: Lives with: lives with their family Lives in: House/apartment Stairs: no STE, 2 story house but does not have to go upstairs  Has following equipment at home: None  OCCUPATION: retired   PLOF: Independent, Independent with basic ADLs, Independent with gait, and Independent with transfers  PATIENT GOALS: be pain free, be able to fish without pain   NEXT MD VISIT: 5-6 weeks from now with referring   OBJECTIVE:   DIAGNOSTIC FINDINGS:  IMPRESSION: 1. Multilevel lumbar disc and facet degeneration without spinal stenosis. 2. Mild bilateral neural foraminal stenosis at L3-4.  PATIENT SURVEYS:  FOTO 65, predicted 70  SCREENING FOR RED FLAGS: Bowel or bladder incontinence: No Spinal tumors: No Cauda equina syndrome: No Compression fracture: Yes: hx of compression fracture, age indeterminate  Abdominal aneurysm: No  COGNITION: Overall cognitive status: Within functional limits for tasks assessed     SENSATION: Reports numbness in B feet   MUSCLE LENGTH:  Hip flexors Moderate limitation B Hamstrings mild limitation R, severe limitation L  Quads moderate limitation B Piriformis mild limitation R, severe limitation L   POSTURE: rounded shoulders, forward head, and decreased lumbar lordosis  PALPATION:   LUMBAR ROM:   AROM eval  Flexion Moderate limitation   Extension Severe limitation compensations from hips  Right lateral flexion WNL   Left lateral flexion WNL   Right rotation   Left rotation    (Blank rows = not tested)  LOWER EXTREMITY ROM:     Active  Right eval Left eval  Hip  flexion    Hip extension    Hip abduction    Hip adduction    Hip internal rotation    Hip external rotation    Knee flexion    Knee extension    Ankle dorsiflexion    Ankle plantarflexion    Ankle inversion    Ankle eversion     (Blank rows = not tested)  LOWER EXTREMITY MMT:    MMT Right eval Left eval  Hip flexion 4+ 4+  Hip extension    Hip abduction 4+ 4  Hip adduction    Hip internal rotation    Hip external rotation    Knee flexion 4 4  Knee extension 4+ 4+  Ankle dorsiflexion 5 5  Ankle plantarflexion    Ankle inversion    Ankle eversion     (Blank rows = not tested)  LUMBAR SPECIAL TESTS:    FUNCTIONAL TESTS:   DGI 20/24  GAIT: Distance walked: in clinic distances  Assistive device utilized: None Level of assistance: Complete Independence Comments: flexed at hips with COM anterior over BOS, + trend  TODAY'S TREATMENT:  DATE:   Eval  Objective tests/measures, appropriate education   TherEx  Nustep L5 x6 minutes BLEs only SKTC 2x5 second holds  DKTC 1x5 seconds  Lumbar rotation stretch 1x5 seconds B      PATIENT EDUCATION:  Education details: exam findings, POC, HEP, relation of imaging to function/PT, PT interventions for acute vs chronic pain  Person educated: Patient Education method: Explanation, Demonstration, and Handouts Education comprehension: verbalized understanding, returned demonstration, and needs further education  HOME EXERCISE PROGRAM:  Access Code: [email or web address] URL: [email or web address] Date: 10/02/2022 Prepared by: Nedra Hai  Exercises - Single Knee to Chest Stretch  - 2 x daily - 7 x weekly - 1 sets - 10 reps - 5 hold - Supine Lower Trunk Rotation  - 2 x daily - 7 x weekly - 1 sets - 10 reps - 5 hold - Supine Double Knee to Chest Modified  - 2 x daily - 7 x weekly - 1 sets -  10 reps - 5 hold  ASSESSMENT:  CLINICAL IMPRESSION: Patient is a 67 y.o. M who was seen today for physical therapy evaluation and treatment for chronic low back pain. Of note, he does fish competitively and has been having some issues with balance. Exam reveals significant lumbar stiffness, functional muscle weakness, postural and biomechanics impairments, severe flexibility impairments, and mild deficits in balance. Will benefit from skilled PT care to address all concerns and assist in reaching optimal level of function.   OBJECTIVE IMPAIRMENTS: Abnormal gait, decreased balance, difficulty walking, decreased ROM, decreased strength, hypomobility, increased fascial restrictions, impaired flexibility, postural dysfunction, obesity, and pain.   ACTIVITY LIMITATIONS: carrying, lifting, standing, squatting, transfers, reach over head, and locomotion level  PARTICIPATION LIMITATIONS: driving, shopping, community activity, and yard work  PERSONAL FACTORS: Age, Behavior pattern, Education, Fitness, Past/current experiences, and Time since onset of injury/illness/exacerbation are also affecting patient's functional outcome.   REHAB POTENTIAL: Fair chronicity of pain, limited to coming 1x/week due to distance from clinic   CLINICAL DECISION MAKING: Stable/uncomplicated  EVALUATION COMPLEXITY: Low   GOALS: Goals reviewed with patient? Yes  SHORT TERM GOALS: Target date: 10/30/2022    Will be compliant with appropriate progressive HEP  Baseline: Goal status: INITIAL  2.  Will demonstrate good functional biomechanics for floor to waist lifting and turning with a load, also for bed mobility  Baseline:  Goal status: INITIAL  3.  Pain to be no more than 5/10 at worst  Baseline:  Goal status: INITIAL  4.  Will demonstrate improved postural mechanics  Baseline:  Goal status: INITIAL   LONG TERM GOALS: Target date: 11/27/2022    MMT to improve by 1 grade in all weak groups  Baseline:   Goal status: INITIAL  2.  Pain to be no more than 3/10 at most  Baseline:  Goal status: INITIAL  3.  Will score 22/24 on DGI  Baseline:  Goal status: INITIAL  4.  Will be able to stand for at least 2 hours without significant increase in pain  Baseline:  Goal status: INITIAL   PLAN:  PT FREQUENCY: 1x/week  PT DURATION: 8 weeks  PLANNED INTERVENTIONS: Therapeutic exercises, Therapeutic activity, Neuromuscular re-education, Balance training, Gait training, Patient/Family education, Self Care, Joint mobilization, Aquatic Therapy, Dry Needling, Electrical stimulation, Spinal mobilization, Cryotherapy, Moist heat, Taping, Traction, Ultrasound, Ionotophoresis 4mg /ml Dexamethasone, Manual therapy, and Re-evaluation.  PLAN FOR NEXT SESSION: needs HEP updates every session, coming 1x/week due to distance from clinic; Work on lumbar ROM, general strength/core strength, biomechanics, flexibility.  Caution with extension as this can increase pain   Nedra HaiKristen Brentley Horrell PT DPT PN2

## 2022-10-09 ENCOUNTER — Ambulatory Visit: Payer: PPO | Admitting: Physical Therapy

## 2022-10-09 ENCOUNTER — Encounter: Payer: Self-pay | Admitting: Physical Therapy

## 2022-10-09 DIAGNOSIS — R29898 Other symptoms and signs involving the musculoskeletal system: Secondary | ICD-10-CM

## 2022-10-09 DIAGNOSIS — M5459 Other low back pain: Secondary | ICD-10-CM

## 2022-10-09 DIAGNOSIS — R293 Abnormal posture: Secondary | ICD-10-CM | POA: Diagnosis not present

## 2022-10-09 DIAGNOSIS — M6281 Muscle weakness (generalized): Secondary | ICD-10-CM

## 2022-10-09 DIAGNOSIS — R2681 Unsteadiness on feet: Secondary | ICD-10-CM

## 2022-10-09 NOTE — Therapy (Signed)
OUTPATIENT PHYSICAL THERAPY THORACOLUMBAR TREATMENT   Patient Name: Carlos [name] MRN: [id number] DOB:09/28/1955, 67 y.o., male Today's Date: 10/09/2022  END OF SESSION:  PT End of Session - 10/09/22 0844     Visit Number 2    Number of Visits 9    Date for PT Re-Evaluation 11/27/22    Authorization Type HTA    Authorization Time Period 10/02/22 to 11/27/22    Progress Note Due on Visit 10    PT Start Time 0843    PT Stop Time 0925    PT Time Calculation (min) 42 min    Activity Tolerance Patient tolerated treatment well    Behavior During Therapy [facility] for tasks assessed/performed              Past Medical History:  Diagnosis Date   Hyperlipidemia    Hypertension    Low testosterone    Pre-diabetes 06/22/2018   03/15/21- pt states not prediabetic as of now   Past Surgical History:  Procedure Laterality Date   COLONOSCOPY  06/20/2017   COLONOSCOPY WITH PROPOFOL  03/29/2021   MENISCUS REPAIR     ORIF TIBIA PLATEAU Left 11/09/2015   Procedure: OPEN REDUCTION INTERNAL (ORIF) FIXATION LEFT LATERAL TIBIAL PLATEAU;  Surgeon: Francena Hanly, MD;  Location: [facility] OR;  Service: Orthopedics;  Laterality: Left;   ROTATOR CUFF REPAIR Right 2002   VASECTOMY     Patient Active Problem List   Diagnosis Date Noted   Hyperlipidemia 07/20/2019   Elevated PSA 06/04/2017   Hypogonadism in male 09/15/2007   Essential hypertension 09/15/2007   DIVERTICULITIS OF COLON 09/15/2007    PCP: Georganna Skeans MD   REFERRING PROVIDER: Juanda Chance, NP  REFERRING DIAG: [phone] (ICD-10-CM) - Chronic bilateral low back pain without sciatica M47.816 (ICD-10-CM) - Spondylosis without myelopathy or radiculopathy, lumbar region M47.816 (ICD-10-CM) - Facet arthropathy, lumbar  Rationale for Evaluation and Treatment: Rehabilitation  THERAPY DIAG:  Other low back pain  Muscle weakness (generalized)  Abnormal posture  Unsteadiness on feet  Other symptoms and signs involving the  musculoskeletal system  ONSET DATE: 09/24/2022  SUBJECTIVE:                                                                                                                                                                                           SUBJECTIVE STATEMENT:  I'm feeling better, did a bass fishing tournament this past weekend and a lot of yard work and pain is better. Not a lot going on otherwise. Will be fishing 3 days in a row on May 3rd/4th/5th, that will be the real test.    PERTINENT  HISTORY:  Expand All Collapse All    Carlos [name] - 67 y.o. male MRN [id number]  Date of birth: 1955/10/23   Office Visit Note: Visit Date: 09/24/2022 PCP: Georganna Skeans, MD Referred by: Georganna Skeans, MD   Subjective:    Chief Complaint  Patient presents with   Lower Back - Pain    HPI: Carlos [name] is a 67 y.o. male who comes in today for evaluation of chronic, worsening and severe bilateral lower back pain, right greater than left. Pain ongoing for several years, worsened over the last several months. No radicular symptoms down the legs. Pain worsens with standing and when moving from sitting to standing position. Pain is especially bad when standing to fish. He describes pain as sore and aching, currently rates as 7 out of 10. Some relief of pain with home exercise regimen, rest and use of medications. Recent lumbar MRI imaging exhibits chronic compression deformity involving the L3 superior endplate, multi level facet changes, most severe at L3-L4, L4-L5 and L5-S1. No high grade spinal stenosis. Patient continues to be active, does walk multiple times a week. Patient denies focal weakness, numbness and tingling. No recent trauma or falls.          Review of Systems  Musculoskeletal:  Positive for back pain.  Neurological:  Negative for tingling, sensory change, focal weakness and weakness.  All other systems reviewed and are negative.  Otherwise per HPI.   Assessment &  Plan: Visit Diagnoses:      ICD-10-CM    1. Chronic bilateral low back pain without sciatica  M54.50 Ambulatory referral to Physical Therapy    G89.29       2. Spondylosis without myelopathy or radiculopathy, lumbar region  M47.816 Ambulatory referral to Physical Therapy     3. Facet arthropathy, lumbar  M47.816 Ambulatory referral to Physical Therapy        Plan: Findings:  Chronic, worsening and severe bilateral lower back pain, right greater than left. No radicular symptoms. Patient continues to have severe pain despite good conservative therapies such as home exercise regimen, rest and use of medications. Patients clinical presentation and exam are consistent with facet mediated pain.  He does have pain with lumbar extension upon exam today.  Recent lumbar MRI imaging does show multilevel facet arthropathy.  We discussed treatment plan in detail today, next step is to place referral for regimen of formal physical therapy.  I do feel he would benefit from manual treatments and core strengthening exercises.  If his pain persists after regimen of physical therapy we discussed possibility of facet joint injections. If he gets good relief of pain with facet joint injections we also discussed longer sustained relief of pain with radiofrequency ablation. I did provide patient with educational information regarding radiofrequency ablation to take home and review. He will follow up in approximately 6 weeks for re-evaluation. No red flag symptoms noted upon exam today.       PAIN:  Are you having pain? Yes: NPRS scale: 1-2/10 Pain location: R lumbar area  Pain description: nagging, can "bark a lot" sometimes   Aggravating factors: unsure  Relieving factors: stretching, activity modification   PRECAUTIONS: None  WEIGHT BEARING RESTRICTIONS: No  FALLS:  Has patient fallen in last 6 months? No 2 falls in the past 2 years   LIVING ENVIRONMENT: Lives with: lives with their family Lives in:  House/apartment Stairs: no STE, 2 story house but does not have to go upstairs  Has  following equipment at home: None  OCCUPATION: retired   PLOF: Independent, Independent with basic ADLs, Independent with gait, and Independent with transfers  PATIENT GOALS: be pain free, be able to fish without pain   NEXT MD VISIT: 5-6 weeks from now with referring   OBJECTIVE:   DIAGNOSTIC FINDINGS:  IMPRESSION: 1. Multilevel lumbar disc and facet degeneration without spinal stenosis. 2. Mild bilateral neural foraminal stenosis at L3-4.  PATIENT SURVEYS:  FOTO 65, predicted 70  SCREENING FOR RED FLAGS: Bowel or bladder incontinence: No Spinal tumors: No Cauda equina syndrome: No Compression fracture: Yes: hx of compression fracture, age indeterminate  Abdominal aneurysm: No  COGNITION: Overall cognitive status: Within functional limits for tasks assessed     SENSATION: Reports numbness in B feet   MUSCLE LENGTH:  Hip flexors Moderate limitation B Hamstrings mild limitation R, severe limitation L  Quads moderate limitation B Piriformis mild limitation R, severe limitation L   POSTURE: rounded shoulders, forward head, and decreased lumbar lordosis  PALPATION:   LUMBAR ROM:   AROM eval  Flexion Moderate limitation   Extension Severe limitation compensations from hips  Right lateral flexion WNL   Left lateral flexion WNL   Right rotation   Left rotation    (Blank rows = not tested)  LOWER EXTREMITY ROM:     Active  Right eval Left eval  Hip flexion    Hip extension    Hip abduction    Hip adduction    Hip internal rotation    Hip external rotation    Knee flexion    Knee extension    Ankle dorsiflexion    Ankle plantarflexion    Ankle inversion    Ankle eversion     (Blank rows = not tested)  LOWER EXTREMITY MMT:    MMT Right eval Left eval  Hip flexion 4+ 4+  Hip extension    Hip abduction 4+ 4  Hip adduction    Hip internal rotation    Hip  external rotation    Knee flexion 4 4  Knee extension 4+ 4+  Ankle dorsiflexion 5 5  Ankle plantarflexion    Ankle inversion    Ankle eversion     (Blank rows = not tested)  LUMBAR SPECIAL TESTS:    FUNCTIONAL TESTS:   DGI 20/24  GAIT: Distance walked: in clinic distances  Assistive device utilized: None Level of assistance: Complete Independence Comments: flexed at hips with COM anterior over BOS, + trend  TODAY'S TREATMENT:                                                                                                                              DATE:   10/09/22  TherEx  Nustep L5x6 minutes BLEs only  PPT x15 with 3 second holds Curl ups limited ROM/neutral lumbar x10 PPT + march x10 B Wiggle worms x10 B for oblique activation PPT + dead bug x10 B HS stretch  with sheet 3x30 seconds B Piriformis stretch 3x30 seconds B figure 4 supine QL stretch 3x30 seconds  Hip hikes x12 B     Eval  Objective tests/measures, appropriate education   TherEx  Nustep L5 x6 minutes BLEs only SKTC 2x5 second holds  DKTC 1x5 seconds  Lumbar rotation stretch 1x5 seconds B      PATIENT EDUCATION:  Education details: exam findings, POC, HEP, relation of imaging to function/PT, PT interventions for acute vs chronic pain  Person educated: Patient Education method: Explanation, Demonstration, and Handouts Education comprehension: verbalized understanding, returned demonstration, and needs further education  HOME EXERCISE PROGRAM:  Access Code: [email or web address] URL: [email or web address] Date: 10/09/2022 Prepared by: Nedra Hai  Exercises - Single Knee to Chest Stretch  - 2 x daily - 7 x weekly - 1 sets - 10 reps - 5 hold - Supine Lower Trunk Rotation  - 2 x daily - 7 x weekly - 1 sets - 10 reps - 5 hold - Supine Double Knee to Chest Modified  - 2 x daily - 7 x weekly - 1 sets - 10 reps - 5 hold - Supine Posterior Pelvic Tilt  - 2 x daily - 7 x weekly - 1 sets -  10-15 reps - 3 hold - Curl Up with Reach  - 2 x daily - 7 x weekly - 1 sets - 10 reps - 3 hold  ASSESSMENT:  CLINICAL IMPRESSION:  Jubal arrives feeling better, sounds like the stretches are helping. Progressed all activities as able and tolerated today. Will continue efforts.   OBJECTIVE IMPAIRMENTS: Abnormal gait, decreased balance, difficulty walking, decreased ROM, decreased strength, hypomobility, increased fascial restrictions, impaired flexibility, postural dysfunction, obesity, and pain.   ACTIVITY LIMITATIONS: carrying, lifting, standing, squatting, transfers, reach over head, and locomotion level  PARTICIPATION LIMITATIONS: driving, shopping, community activity, and yard work  PERSONAL FACTORS: Age, Behavior pattern, Education, Fitness, Past/current experiences, and Time since onset of injury/illness/exacerbation are also affecting patient's functional outcome.   REHAB POTENTIAL: Fair chronicity of pain, limited to coming 1x/week due to distance from clinic   CLINICAL DECISION MAKING: Stable/uncomplicated  EVALUATION COMPLEXITY: Low   GOALS: Goals reviewed with patient? Yes  SHORT TERM GOALS: Target date: 10/30/2022    Will be compliant with appropriate progressive HEP  Baseline: Goal status: INITIAL  2.  Will demonstrate good functional biomechanics for floor to waist lifting and turning with a load, also for bed mobility  Baseline:  Goal status: INITIAL  3.  Pain to be no more than 5/10 at worst  Baseline:  Goal status: INITIAL  4.  Will demonstrate improved postural mechanics  Baseline:  Goal status: INITIAL   LONG TERM GOALS: Target date: 11/27/2022    MMT to improve by 1 grade in all weak groups  Baseline:  Goal status: INITIAL  2.  Pain to be no more than 3/10 at most  Baseline:  Goal status: INITIAL  3.  Will score 22/24 on DGI  Baseline:  Goal status: INITIAL  4.  Will be able to stand for at least 2 hours without significant increase in pain   Baseline:  Goal status: INITIAL   PLAN:  PT FREQUENCY: 1x/week  PT DURATION: 8 weeks  PLANNED INTERVENTIONS: Therapeutic exercises, Therapeutic activity, Neuromuscular re-education, Balance training, Gait training, Patient/Family education, Self Care, Joint mobilization, Aquatic Therapy, Dry Needling, Electrical stimulation, Spinal mobilization, Cryotherapy, Moist heat, Taping, Traction, Ultrasound, Ionotophoresis /ml Dexamethasone, Manual therapy, and Re-evaluation.  PLAN FOR NEXT SESSION: needs HEP updates  every session, coming 1x/week due to distance from clinic; Work on lumbar ROM, general strength/core strength, biomechanics, flexibility. Caution with extension as this can increase pain. Progress core, try swiss ball?   Nedra Hai PT DPT PN2

## 2022-10-15 NOTE — Therapy (Addendum)
OUTPATIENT PHYSICAL THERAPY TREATMENT   Patient Name: Carlos [name] MRN: [id number] DOB:05-06-1956, 67 y.o., male Today's Date: 10/16/2022  END OF SESSION:  PT End of Session - 10/16/22 0848     Visit Number 3    Number of Visits 9    Date for PT Re-Evaluation 11/27/22    Authorization Type HTA    Authorization Time Period 10/02/22 to 11/27/22    Progress Note Due on Visit 10    PT Start Time 0843    PT Stop Time 0922    PT Time Calculation (min) 39 min    Activity Tolerance Patient tolerated treatment well    Behavior During Therapy [facility] for tasks assessed/performed               Past Medical History:  Diagnosis Date   Hyperlipidemia    Hypertension    Low testosterone    Pre-diabetes 06/22/2018   03/15/21- pt states not prediabetic as of now   Past Surgical History:  Procedure Laterality Date   COLONOSCOPY  06/20/2017   COLONOSCOPY WITH PROPOFOL  03/29/2021   MENISCUS REPAIR     ORIF TIBIA PLATEAU Left 11/09/2015   Procedure: OPEN REDUCTION INTERNAL (ORIF) FIXATION LEFT LATERAL TIBIAL PLATEAU;  Surgeon: Francena Hanly, MD;  Location: [facility] OR;  Service: Orthopedics;  Laterality: Left;   ROTATOR CUFF REPAIR Right 2002   VASECTOMY     Patient Active Problem List   Diagnosis Date Noted   Hyperlipidemia 07/20/2019   Elevated PSA 06/04/2017   Hypogonadism in male 09/15/2007   Essential hypertension 09/15/2007   DIVERTICULITIS OF COLON 09/15/2007    PCP: Georganna Skeans MD   REFERRING PROVIDER: Juanda Chance, NP  REFERRING DIAG: [phone] (ICD-10-CM) - Chronic bilateral low back pain without sciatica M47.816 (ICD-10-CM) - Spondylosis without myelopathy or radiculopathy, lumbar region M47.816 (ICD-10-CM) - Facet arthropathy, lumbar  Rationale for Evaluation and Treatment: Rehabilitation  THERAPY DIAG:  Other low back pain  Muscle weakness (generalized)  Abnormal posture  Unsteadiness on feet  Other symptoms and signs involving the musculoskeletal  system  ONSET DATE: 09/24/2022  SUBJECTIVE:                                                                                                                                                                                           SUBJECTIVE STATEMENT: He indicated getting improvements in symptoms and feeling like stretching is helpful.  Pt indicated he was able to fish with good tolerance.   PERTINENT HISTORY:    PAIN:  NPRS scale: 0/10 at arrival, pain at worst 3-4/10 Pain location: R lumbar area  Pain description:  nagging, can "bark a lot" sometimes   Aggravating factors: unsure  Relieving factors: stretching, activity modification   PRECAUTIONS: None  WEIGHT BEARING RESTRICTIONS: No  FALLS:  Has patient fallen in last 6 months? No 2 falls in the past 2 years   LIVING ENVIRONMENT: Lives with: lives with their family Lives in: House/apartment Stairs: no STE, 2 story house but does not have to go upstairs  Has following equipment at home: None  OCCUPATION: retired   PLOF: Independent, Independent with basic ADLs, Independent with gait, and Independent with transfers  PATIENT GOALS: be pain free, be able to fish without pain   NEXT MD VISIT: 5-6 weeks from now with referring   OBJECTIVE:   DIAGNOSTIC FINDINGS:  10/02/2022 IMPRESSION: 1. Multilevel lumbar disc and facet degeneration without spinal stenosis. 2. Mild bilateral neural foraminal stenosis at L3-4.  PATIENT SURVEYS:  10/02/2022 FOTO 65, predicted 70  SCREENING FOR RED FLAGS: 10/02/2022 Bowel or bladder incontinence: No Spinal tumors: No Cauda equina syndrome: No Compression fracture: Yes: hx of compression fracture, age indeterminate  Abdominal aneurysm: No  COGNITION: 10/02/2022 Overall cognitive status: Within functional limits for tasks assessed     SENSATION: 10/02/2022 Reports numbness in bilateral feet   MUSCLE LENGTH: 10/02/2022 Hip flexors Moderate limitation B Hamstrings mild  limitation R, severe limitation L  Quads moderate limitation B Piriformis mild limitation R, severe limitation L   POSTURE:  10/02/2022  rounded shoulders, forward head, and decreased lumbar lordosis  PALPATION:   LUMBAR ROM:   AROM 10/02/2022 10/16/2022  Flexion Moderate limitation  To ankles without pain symptoms   Extension Severe limitation compensations from hips 100 % without pain complaints.   Right lateral flexion WNL    Left lateral flexion WNL    Right rotation    Left rotation     (Blank rows = not tested)  LOWER EXTREMITY ROM:     Active  Right 10/02/2022 Left 10/02/2022  Hip flexion    Hip extension    Hip abduction    Hip adduction    Hip internal rotation    Hip external rotation    Knee flexion    Knee extension    Ankle dorsiflexion    Ankle plantarflexion    Ankle inversion    Ankle eversion     (Blank rows = not tested)  LOWER EXTREMITY MMT:    MMT Right 10/02/2022 Left 10/02/2022  Hip flexion 4+ 4+  Hip extension    Hip abduction 4+ 4  Hip adduction    Hip internal rotation    Hip external rotation    Knee flexion 4 4  Knee extension 4+ 4+  Ankle dorsiflexion 5 5  Ankle plantarflexion    Ankle inversion    Ankle eversion     (Blank rows = not tested)  LUMBAR SPECIAL TESTS:    FUNCTIONAL TESTS:  10/02/2022 DGI 20/24  GAIT: 10/02/2022 Distance walked: in clinic distances  Assistive device utilized: None Level of assistance: Complete Independence Comments: flexed at hips with COM anterior over BOS, + trend  TODAY'S TREATMENT:                                                              DATE:  10/16/2022 TherEx Hooklying PPT c isometric press of UE  into knees 3 sec hold x 15 Supine dying bug x 10 bilateral Supine bridge 2-3 sec hold x 15 Prone opposite arm/leg lift 3 sec hold x 10 bilateral Leg press double leg 100 lbs x 15, single leg x 15  56 lbs Nustep Lvl 6 UE/LE 10 mins   TODAY'S TREATMENT:                                                               DATE:  10/09/22 TherEx Nustep L5x6 minutes BLEs only  PPT x15 with 3 second holds Curl ups limited ROM/neutral lumbar x10 PPT + march x10 B Wiggle worms x10 B for oblique activation PPT + dead bug x10 B HS stretch with sheet 3x30 seconds B Piriformis stretch 3x30 seconds B figure 4 supine QL stretch 3x30 seconds  Hip hikes x12 B     TODAY'S TREATMENT:                                                              DATE:  Eval Objective tests/measures, appropriate education   TherEx  Nustep L5 x6 minutes BLEs only SKTC 2x5 second holds  DKTC 1x5 seconds  Lumbar rotation stretch 1x5 seconds B   PATIENT EDUCATION:  Education details: exam findings, POC, HEP, relation of imaging to function/PT, PT interventions for acute vs chronic pain  Person educated: Patient Education method: Explanation, Demonstration, and Handouts Education comprehension: verbalized understanding, returned demonstration, and needs further education  HOME EXERCISE PROGRAM: Access Code: [email or web address] URL: [email or web address] Date: 10/16/2022 Prepared by: Chyrel Masson  Exercises - Single Knee to Chest Stretch  - 2 x daily - 7 x weekly - 1 sets - 10 reps - 5 hold - Supine Lower Trunk Rotation  - 2 x daily - 7 x weekly - 1 sets - 10 reps - 5 hold - Supine Double Knee to Chest Modified  - 2 x daily - 7 x weekly - 1 sets - 10 reps - 5 hold - Supine Posterior Pelvic Tilt  - 2 x daily - 7 x weekly - 1 sets - 10-15 reps - 3 hold - Curl Up with Reach  - 2 x daily - 7 x weekly - 1 sets - 10 reps - 3 hold - Prone Alternating Arm and Leg Lifts  - 1-2 x daily - 7 x weekly - 1 sets - 10 reps - 3 hold - Supine Bridge  - 1-2 x daily - 7 x weekly - 1-2 sets - 10 reps - 2 hold  ASSESSMENT:  CLINICAL IMPRESSION: Pinchus continued to report improve symptom management at home with HEP.  Pt to continue to benefit from progressive core strengthening/endurance to improve tolerance to  house/recreational activity.  Added 2 new exercises for home.  Plan to reassess response next visit.   OBJECTIVE IMPAIRMENTS: Abnormal gait, decreased balance, difficulty walking, decreased ROM, decreased strength, hypomobility, increased fascial restrictions, impaired flexibility, postural dysfunction, obesity, and pain.   ACTIVITY LIMITATIONS: carrying, lifting, standing, squatting, transfers, reach over head, and locomotion level  PARTICIPATION LIMITATIONS: driving, shopping, community activity, and yard  work  PERSONAL FACTORS: Age, Behavior pattern, Education, Fitness, Past/current experiences, and Time since onset of injury/illness/exacerbation are also affecting patient's functional outcome.   REHAB POTENTIAL: Fair chronicity of pain, limited to coming 1x/week due to distance from clinic   CLINICAL DECISION MAKING: Stable/uncomplicated  EVALUATION COMPLEXITY: Low   GOALS: Goals reviewed with patient? Yes  SHORT TERM GOALS: Target date: 10/30/2022    Will be compliant with appropriate progressive HEP  Baseline: Goal status: Met  2.  Will demonstrate good functional biomechanics for floor to waist lifting and turning with a load, also for bed mobility  Baseline:  Goal status: on going 10/16/2022  3.  Pain to be no more than 5/10 at worst  Baseline:  Goal status: Met 10/16/2022  4.  Will demonstrate improved postural mechanics  Baseline:  Goal status: on going 10/16/2022   LONG TERM GOALS: Target date: 11/27/2022    MMT to improve by 1 grade in all weak groups  Baseline:  Goal status: on going 10/16/2022  2.  Pain to be no more than 3/10 at most  Baseline:  Goal status: on going 10/16/2022  3.  Will score 22/24 on DGI  Baseline:  Goal status: on going 10/16/2022  4.  Will be able to stand for at least 2 hours without significant increase in pain  Baseline:  Goal status: on going 10/16/2022   PLAN:  PT FREQUENCY: 1x/week  PT DURATION: 8 weeks  PLANNED  INTERVENTIONS: Therapeutic exercises, Therapeutic activity, Neuromuscular re-education, Balance training, Gait training, Patient/Family education, Self Care, Joint mobilization, Aquatic Therapy, Dry Needling, Electrical stimulation, Spinal mobilization, Cryotherapy, Moist heat, Taping, Traction, Ultrasound, Ionotophoresis 4mg /ml Dexamethasone, Manual therapy, and Re-evaluation.  PLAN FOR NEXT SESSION: Recheck hip strength, possible FOTO recheck if symptoms continue   Chyrel Masson, PT, DPT, OCS, ATC 10/23/22  7:58 AM

## 2022-10-16 ENCOUNTER — Encounter: Payer: Self-pay | Admitting: Rehabilitative and Restorative Service Providers"

## 2022-10-16 ENCOUNTER — Ambulatory Visit: Payer: PPO | Admitting: Rehabilitative and Restorative Service Providers"

## 2022-10-16 DIAGNOSIS — M6281 Muscle weakness (generalized): Secondary | ICD-10-CM | POA: Diagnosis not present

## 2022-10-16 DIAGNOSIS — R293 Abnormal posture: Secondary | ICD-10-CM

## 2022-10-16 DIAGNOSIS — R29898 Other symptoms and signs involving the musculoskeletal system: Secondary | ICD-10-CM

## 2022-10-16 DIAGNOSIS — R2681 Unsteadiness on feet: Secondary | ICD-10-CM

## 2022-10-16 DIAGNOSIS — M5459 Other low back pain: Secondary | ICD-10-CM | POA: Diagnosis not present

## 2022-10-23 ENCOUNTER — Encounter: Payer: Self-pay | Admitting: Rehabilitative and Restorative Service Providers"

## 2022-10-23 ENCOUNTER — Ambulatory Visit: Payer: PPO | Admitting: Rehabilitative and Restorative Service Providers"

## 2022-10-23 DIAGNOSIS — M5459 Other low back pain: Secondary | ICD-10-CM | POA: Diagnosis not present

## 2022-10-23 DIAGNOSIS — R293 Abnormal posture: Secondary | ICD-10-CM

## 2022-10-23 DIAGNOSIS — M6281 Muscle weakness (generalized): Secondary | ICD-10-CM | POA: Diagnosis not present

## 2022-10-23 DIAGNOSIS — R29898 Other symptoms and signs involving the musculoskeletal system: Secondary | ICD-10-CM

## 2022-10-23 DIAGNOSIS — R2681 Unsteadiness on feet: Secondary | ICD-10-CM

## 2022-10-23 NOTE — Therapy (Signed)
OUTPATIENT PHYSICAL THERAPY TREATMENT   Patient Name: Carlos [name] MRN: [id number] DOB:07/17/1955, 67 y.o., male Today's Date: 10/23/2022  END OF SESSION:  PT End of Session - 10/23/22 0821     Visit Number 4    Number of Visits 9    Date for PT Re-Evaluation 11/27/22    Authorization Type HTA    Authorization Time Period 10/02/22 to 11/27/22    Progress Note Due on Visit 10    PT Start Time 0816    PT Stop Time 0856    PT Time Calculation (min) 40 min    Activity Tolerance Patient tolerated treatment well    Behavior During Therapy [facility] for tasks assessed/performed                Past Medical History:  Diagnosis Date   Hyperlipidemia    Hypertension    Low testosterone    Pre-diabetes 06/22/2018   03/15/21- pt states not prediabetic as of now   Past Surgical History:  Procedure Laterality Date   COLONOSCOPY  06/20/2017   COLONOSCOPY WITH PROPOFOL  03/29/2021   MENISCUS REPAIR     ORIF TIBIA PLATEAU Left 11/09/2015   Procedure: OPEN REDUCTION INTERNAL (ORIF) FIXATION LEFT LATERAL TIBIAL PLATEAU;  Surgeon: Francena Hanly, MD;  Location: [facility] OR;  Service: Orthopedics;  Laterality: Left;   ROTATOR CUFF REPAIR Right 2002   VASECTOMY     Patient Active Problem List   Diagnosis Date Noted   Hyperlipidemia 07/20/2019   Elevated PSA 06/04/2017   Hypogonadism in male 09/15/2007   Essential hypertension 09/15/2007   DIVERTICULITIS OF COLON 09/15/2007    PCP: Georganna Skeans MD   REFERRING PROVIDER: Juanda Chance, NP  REFERRING DIAG: [phone] (ICD-10-CM) - Chronic bilateral low back pain without sciatica M47.816 (ICD-10-CM) - Spondylosis without myelopathy or radiculopathy, lumbar region M47.816 (ICD-10-CM) - Facet arthropathy, lumbar  Rationale for Evaluation and Treatment: Rehabilitation  THERAPY DIAG:  Other low back pain  Muscle weakness (generalized)  Abnormal posture  Unsteadiness on feet  Other symptoms and signs involving the musculoskeletal  system  ONSET DATE: 09/24/2022  SUBJECTIVE:                                                                                                                                                                                           SUBJECTIVE STATEMENT: He reported feeling better with fishing one day.  He reported multiple day fishing this weekend upcoming.  Reported back of Rt knee/hamstring distal symptoms randomly in last day or two.    He reported being able to walk longer distance without increase of symptoms.  PERTINENT HISTORY:    PAIN:  NPRS scale: 2/10  Pain location: Rt lumbar area  Pain description: nagging, can "bark a lot" sometimes   Aggravating factors: unsure  Relieving factors: stretching, activity modification   PRECAUTIONS: None  WEIGHT BEARING RESTRICTIONS: No  FALLS:  Has patient fallen in last 6 months? No 2 falls in the past 2 years   LIVING ENVIRONMENT: Lives with: lives with their family Lives in: House/apartment Stairs: no STE, 2 story house but does not have to go upstairs  Has following equipment at home: None  OCCUPATION: retired   PLOF: Independent, Independent with basic ADLs, Independent with gait, and Independent with transfers  PATIENT GOALS: be pain free, be able to fish without pain   NEXT MD VISIT: 5-6 weeks from now with referring   OBJECTIVE:   DIAGNOSTIC FINDINGS:  10/02/2022 IMPRESSION: 1. Multilevel lumbar disc and facet degeneration without spinal stenosis. 2. Mild bilateral neural foraminal stenosis at L3-4.  PATIENT SURVEYS:  10/23/2022  FOTO update:   74  10/02/2022 FOTO 65, predicted 70  SCREENING FOR RED FLAGS: 10/02/2022 Bowel or bladder incontinence: No Spinal tumors: No Cauda equina syndrome: No Compression fracture: Yes: hx of compression fracture, age indeterminate  Abdominal aneurysm: No  COGNITION: 10/02/2022 Overall cognitive status: Within functional limits for tasks  assessed     SENSATION: 10/02/2022 Reports numbness in bilateral feet   MUSCLE LENGTH: 10/02/2022 Hip flexors Moderate limitation B Hamstrings mild limitation R, severe limitation L  Quads moderate limitation B Piriformis mild limitation R, severe limitation L   POSTURE:  10/02/2022  rounded shoulders, forward head, and decreased lumbar lordosis  PALPATION:   LUMBAR ROM:   AROM 10/02/2022 10/16/2022  Flexion Moderate limitation  To ankles without pain symptoms   Extension Severe limitation compensations from hips 100 % without pain complaints.   Right lateral flexion WNL    Left lateral flexion WNL    Right rotation    Left rotation     (Blank rows = not tested)  LOWER EXTREMITY ROM:     Active  Right 10/02/2022 Left 10/02/2022  Hip flexion    Hip extension    Hip abduction    Hip adduction    Hip internal rotation    Hip external rotation    Knee flexion    Knee extension    Ankle dorsiflexion    Ankle plantarflexion    Ankle inversion    Ankle eversion     (Blank rows = not tested)  LOWER EXTREMITY MMT:    MMT Right 10/02/2022 Left 10/02/2022 Right 10/23/2022 Left 10/23/2022  Hip flexion 4+ 4+ 5/5 5/5  Hip extension      Hip abduction 4+ 4    Hip adduction      Hip internal rotation      Hip external rotation      Knee flexion 4 4 5/5 5/5  Knee extension 4+ 4+ 5/5 5/5  Ankle dorsiflexion 5 5    Ankle plantarflexion      Ankle inversion      Ankle eversion       (Blank rows = not tested)  LUMBAR SPECIAL TESTS:    FUNCTIONAL TESTS:  10/02/2022 DGI 20/24  GAIT: 10/02/2022 Distance walked: in clinic distances  Assistive device utilized: None Level of assistance: Complete Independence Comments: flexed at hips with COM anterior over BOS, + trend  TODAY'S TREATMENT:  DATE:  10/23/2022 TherEx Nustep Lvl 6 UE/LE 10 mins Standing lumbar extension AROM x 5 Standing blue band isometric walk outs  laterally with bilateral UE extended 5 sec hold x 10, performed facing bilaterally Standing blue band diagonal chop down and up x 10 slowly each direction, bilaterally Standing blue band GH ext 3 sec hold past legs x 20  Leg press double leg 100 lbs x 15, single leg 2 x 15  56 lbs Machine rows 3 x 15 55 lbs   TODAY'S TREATMENT:                                                              DATE:  10/16/2022 TherEx Hooklying PPT c isometric press of UE into knees 3 sec hold x 15 Supine dying bug x 10 bilateral Supine bridge 2-3 sec hold x 15 Prone opposite arm/leg lift 3 sec hold x 10 bilateral Leg press double leg 100 lbs x 15, single leg x 15  56 lbs Nustep Lvl 6 UE/LE 10 mins   TODAY'S TREATMENT:                                                              DATE:  10/09/22 TherEx Nustep L5x6 minutes BLEs only  PPT x15 with 3 second holds Curl ups limited ROM/neutral lumbar x10 PPT + march x10 B Wiggle worms x10 B for oblique activation PPT + dead bug x10 B HS stretch with sheet 3x30 seconds B Piriformis stretch 3x30 seconds B figure 4 supine QL stretch 3x30 seconds  Hip hikes x12 B     TODAY'S TREATMENT:                                                              DATE:  Eval Objective tests/measures, appropriate education   TherEx  Nustep L5 x6 minutes BLEs only SKTC 2x5 second holds  DKTC 1x5 seconds  Lumbar rotation stretch 1x5 seconds B   PATIENT EDUCATION:  Education details: exam findings, POC, HEP, relation of imaging to function/PT, PT interventions for acute vs chronic pain  Person educated: Patient Education method: Explanation, Demonstration, and Handouts Education comprehension: verbalized understanding, returned demonstration, and needs further education  HOME EXERCISE PROGRAM: Access Code: [email or web address] URL: [email or web address] Date: 10/16/2022 Prepared by: Chyrel Masson  Exercises - Single Knee to Chest Stretch  - 2 x daily - 7 x  weekly - 1 sets - 10 reps - 5 hold - Supine Lower Trunk Rotation  - 2 x daily - 7 x weekly - 1 sets - 10 reps - 5 hold - Supine Double Knee to Chest Modified  - 2 x daily - 7 x weekly - 1 sets - 10 reps - 5 hold - Supine Posterior Pelvic Tilt  - 2 x daily - 7 x weekly - 1 sets - 10-15 reps - 3 hold - Curl Up  with Reach  - 2 x daily - 7 x weekly - 1 sets - 10 reps - 3 hold - Prone Alternating Arm and Leg Lifts  - 1-2 x daily - 7 x weekly - 1 sets - 10 reps - 3 hold - Supine Bridge  - 1-2 x daily - 7 x weekly - 1-2 sets - 10 reps - 2 hold  ASSESSMENT:  CLINICAL IMPRESSION: Pt continued to report general improvements in back related symptoms c tolerance to activities improving.  FOTO score improved compared to evaluation.  Pt has fishing tournament this weekend for several days and then NP follow up visit next week.  Will reassess results and presentation next week for possible HEP transitioning if symptoms respond well.    OBJECTIVE IMPAIRMENTS: Abnormal gait, decreased balance, difficulty walking, decreased ROM, decreased strength, hypomobility, increased fascial restrictions, impaired flexibility, postural dysfunction, obesity, and pain.   ACTIVITY LIMITATIONS: carrying, lifting, standing, squatting, transfers, reach over head, and locomotion level  PARTICIPATION LIMITATIONS: driving, shopping, community activity, and yard work  PERSONAL FACTORS: Age, Behavior pattern, Education, Fitness, Past/current experiences, and Time since onset of injury/illness/exacerbation are also affecting patient's functional outcome.   REHAB POTENTIAL: Fair chronicity of pain, limited to coming 1x/week due to distance from clinic   CLINICAL DECISION MAKING: Stable/uncomplicated  EVALUATION COMPLEXITY: Low   GOALS: Goals reviewed with patient? Yes  SHORT TERM GOALS: Target date: 10/30/2022    Will be compliant with appropriate progressive HEP  Baseline: Goal status: Met  2.  Will demonstrate good  functional biomechanics for floor to waist lifting and turning with a load, also for bed mobility  Baseline:  Goal status: on going 10/16/2022  3.  Pain to be no more than 5/10 at worst  Baseline:  Goal status: Met 10/16/2022  4.  Will demonstrate improved postural mechanics  Baseline:  Goal status: on going 10/16/2022   LONG TERM GOALS: Target date: 11/27/2022    MMT to improve by 1 grade in all weak groups  Baseline:  Goal status: on going 10/16/2022  2.  Pain to be no more than 3/10 at most  Baseline:  Goal status: on going 10/16/2022  3.  Will score 22/24 on DGI  Baseline:  Goal status: on going 10/16/2022  4.  Will be able to stand for at least 2 hours without significant increase in pain  Baseline:  Goal status: on going 10/16/2022   PLAN:  PT FREQUENCY: 1x/week  PT DURATION: 8 weeks  PLANNED INTERVENTIONS: Therapeutic exercises, Therapeutic activity, Neuromuscular re-education, Balance training, Gait training, Patient/Family education, Self Care, Joint mobilization, Aquatic Therapy, Dry Needling, Electrical stimulation, Spinal mobilization, Cryotherapy, Moist heat, Taping, Traction, Ultrasound, Ionotophoresis 4mg /ml Dexamethasone, Manual therapy, and Re-evaluation.  PLAN FOR NEXT SESSION:  Check how fishing tournament was and make decision on any HEP transitioning.     Chyrel Masson, PT, DPT, OCS, ATC 10/23/22  9:07 AM

## 2022-10-30 ENCOUNTER — Encounter: Payer: Self-pay | Admitting: Rehabilitative and Restorative Service Providers"

## 2022-10-30 ENCOUNTER — Ambulatory Visit: Payer: PPO | Admitting: Rehabilitative and Restorative Service Providers"

## 2022-10-30 DIAGNOSIS — R293 Abnormal posture: Secondary | ICD-10-CM

## 2022-10-30 DIAGNOSIS — R29898 Other symptoms and signs involving the musculoskeletal system: Secondary | ICD-10-CM

## 2022-10-30 DIAGNOSIS — R2681 Unsteadiness on feet: Secondary | ICD-10-CM | POA: Diagnosis not present

## 2022-10-30 DIAGNOSIS — M5459 Other low back pain: Secondary | ICD-10-CM

## 2022-10-30 DIAGNOSIS — M6281 Muscle weakness (generalized): Secondary | ICD-10-CM

## 2022-10-30 NOTE — Therapy (Addendum)
OUTPATIENT PHYSICAL THERAPY TREATMENT /DISCHARGE   Patient Name: Carlos [name] MRN: [id number] DOB:May 02, 1956, 67 y.o., male Today's Date: 10/30/2022  END OF SESSION:  PT End of Session - 10/30/22 0831     Visit Number 5    Number of Visits 9    Date for PT Re-Evaluation 11/27/22    Authorization Type HTA    Authorization Time Period 10/02/22 to 11/27/22    Progress Note Due on Visit 10    PT Start Time 0835    PT Stop Time 0859    PT Time Calculation (min) 24 min    Activity Tolerance Patient tolerated treatment well    Behavior During Therapy [facility] for tasks assessed/performed                 Past Medical History:  Diagnosis Date   Hyperlipidemia    Hypertension    Low testosterone    Pre-diabetes 06/22/2018   03/15/21- pt states not prediabetic as of now   Past Surgical History:  Procedure Laterality Date   COLONOSCOPY  06/20/2017   COLONOSCOPY WITH PROPOFOL  03/29/2021   MENISCUS REPAIR     ORIF TIBIA PLATEAU Left 11/09/2015   Procedure: OPEN REDUCTION INTERNAL (ORIF) FIXATION LEFT LATERAL TIBIAL PLATEAU;  Surgeon: Francena Hanly, MD;  Location: [facility] OR;  Service: Orthopedics;  Laterality: Left;   ROTATOR CUFF REPAIR Right 2002   VASECTOMY     Patient Active Problem List   Diagnosis Date Noted   Hyperlipidemia 07/20/2019   Elevated PSA 06/04/2017   Hypogonadism in male 09/15/2007   Essential hypertension 09/15/2007   DIVERTICULITIS OF COLON 09/15/2007    PCP: Georganna Skeans MD   REFERRING PROVIDER: Juanda Chance, NP  REFERRING DIAG: [phone] (ICD-10-CM) - Chronic bilateral low back pain without sciatica M47.816 (ICD-10-CM) - Spondylosis without myelopathy or radiculopathy, lumbar region M47.816 (ICD-10-CM) - Facet arthropathy, lumbar  Rationale for Evaluation and Treatment: Rehabilitation  THERAPY DIAG:  Other low back pain  Muscle weakness (generalized)  Abnormal posture  Unsteadiness on feet  Other symptoms and signs involving the  musculoskeletal system  ONSET DATE: 09/24/2022  SUBJECTIVE:                                                                                                                                                                                           SUBJECTIVE STATEMENT: He indicated the fishing quality wasn't good but his back did fine with use of HEP.    Reported +5 quite a bit better on GROC.  PERTINENT HISTORY:    PAIN:  NPRS scale: 2/10 at worst Pain location: Rt lumbar area  Pain description: discomfort/tightness Aggravating factors: unsure  Relieving factors: stretching, activity modification   PRECAUTIONS: None  WEIGHT BEARING RESTRICTIONS: No  FALLS:  Has patient fallen in last 6 months? No 2 falls in the past 2 years   LIVING ENVIRONMENT: Lives with: lives with their family Lives in: House/apartment Stairs: no STE, 2 story house but does not have to go upstairs  Has following equipment at home: None  OCCUPATION: retired   PLOF: Independent, Independent with basic ADLs, Independent with gait, and Independent with transfers  PATIENT GOALS: be pain free, be able to fish without pain   NEXT MD VISIT: 5-6 weeks from now with referring   OBJECTIVE:   DIAGNOSTIC FINDINGS:  10/02/2022 IMPRESSION: 1. Multilevel lumbar disc and facet degeneration without spinal stenosis. 2. Mild bilateral neural foraminal stenosis at L3-4.  PATIENT SURVEYS:  10/23/2022  FOTO update:   74  10/02/2022 FOTO 65, predicted 70  SCREENING FOR RED FLAGS: 10/02/2022 Bowel or bladder incontinence: No Spinal tumors: No Cauda equina syndrome: No Compression fracture: Yes: hx of compression fracture, age indeterminate  Abdominal aneurysm: No  COGNITION: 10/02/2022 Overall cognitive status: Within functional limits for tasks assessed     SENSATION: 10/02/2022 Reports numbness in bilateral feet   MUSCLE LENGTH: 10/02/2022 Hip flexors Moderate limitation B Hamstrings mild limitation R,  severe limitation L  Quads moderate limitation B Piriformis mild limitation R, severe limitation L   POSTURE:  10/02/2022  rounded shoulders, forward head, and decreased lumbar lordosis  PALPATION:   LUMBAR ROM:   AROM 10/02/2022 10/16/2022  Flexion Moderate limitation  To ankles without pain symptoms   Extension Severe limitation compensations from hips 100 % without pain complaints.   Right lateral flexion WNL    Left lateral flexion WNL    Right rotation    Left rotation     (Blank rows = not tested)  LOWER EXTREMITY ROM:     Active  Right 10/02/2022 Left 10/02/2022  Hip flexion    Hip extension    Hip abduction    Hip adduction    Hip internal rotation    Hip external rotation    Knee flexion    Knee extension    Ankle dorsiflexion    Ankle plantarflexion    Ankle inversion    Ankle eversion     (Blank rows = not tested)  LOWER EXTREMITY MMT:    MMT Right 10/02/2022 Left 10/02/2022 Right 10/23/2022 Left 10/23/2022  Hip flexion 4+ 4+ 5/5 5/5  Hip extension      Hip abduction 4+ 4    Hip adduction      Hip internal rotation      Hip external rotation      Knee flexion 4 4 5/5 5/5  Knee extension 4+ 4+ 5/5 5/5  Ankle dorsiflexion 5 5    Ankle plantarflexion      Ankle inversion      Ankle eversion       (Blank rows = not tested)  LUMBAR SPECIAL TESTS:    FUNCTIONAL TESTS:  10/02/2022 DGI 20/24  GAIT: 10/02/2022 Distance walked: in clinic distances  Assistive device utilized: None Level of assistance: Complete Independence Comments: flexed at hips with COM anterior over BOS, + trend  TODAY'S TREATMENT:  DATE:  10/30/2022 TherEx Nustep Lvl 6 UE/LE 10 mins Machine rows 3 x 15 55 lbs Standing bilateral shoulder scapular retraction c ER blue band x 10  Instruction and demo for blue band rows, gh ext bilateral for strengthening.    TODAY'S TREATMENT:                                                               DATE:  10/23/2022 TherEx Nustep Lvl 6 UE/LE 10 mins Standing lumbar extension AROM x 5 Standing blue band isometric walk outs laterally with bilateral UE extended 5 sec hold x 10, performed facing bilaterally Standing blue band diagonal chop down and up x 10 slowly each direction, bilaterally Standing blue band GH ext 3 sec hold past legs x 20  Leg press double leg 100 lbs x 15, single leg 2 x 15  56 lbs Machine rows 3 x 15 55 lbs   TODAY'S TREATMENT:                                                              DATE:  10/16/2022 TherEx Hooklying PPT c isometric press of UE into knees 3 sec hold x 15 Supine dying bug x 10 bilateral Supine bridge 2-3 sec hold x 15 Prone opposite arm/leg lift 3 sec hold x 10 bilateral Leg press double leg 100 lbs x 15, single leg x 15  56 lbs Nustep Lvl 6 UE/LE 10 mins   TODAY'S TREATMENT:                                                              DATE:  10/09/22 TherEx Nustep L5x6 minutes BLEs only  PPT x15 with 3 second holds Curl ups limited ROM/neutral lumbar x10 PPT + march x10 B Wiggle worms x10 B for oblique activation PPT + dead bug x10 B HS stretch with sheet 3x30 seconds B Piriformis stretch 3x30 seconds B figure 4 supine QL stretch 3x30 seconds  Hip hikes x12 B    PATIENT EDUCATION:  Education details: exam findings, POC, HEP, relation of imaging to function/PT, PT interventions for acute vs chronic pain  Person educated: Patient Education method: Explanation, Demonstration, and Handouts Education comprehension: verbalized understanding, returned demonstration, and needs further education  HOME EXERCISE PROGRAM: Access Code: [email or web address] URL: [email or web address] Date: 10/16/2022 Prepared by: Chyrel Masson  Exercises - Single Knee to Chest Stretch  - 2 x daily - 7 x weekly - 1 sets - 10 reps - 5 hold - Supine Lower Trunk Rotation  - 2 x daily - 7 x weekly - 1 sets - 10 reps - 5 hold - Supine  Double Knee to Chest Modified  - 2 x daily - 7 x weekly - 1 sets - 10 reps - 5 hold - Supine Posterior Pelvic Tilt  - 2 x daily - 7 x weekly - 1 sets -  10-15 reps - 3 hold - Curl Up with Reach  - 2 x daily - 7 x weekly - 1 sets - 10 reps - 3 hold - Prone Alternating Arm and Leg Lifts  - 1-2 x daily - 7 x weekly - 1 sets - 10 reps - 3 hold - Supine Bridge  - 1-2 x daily - 7 x weekly - 1-2 sets - 10 reps - 2 hold  ASSESSMENT:  CLINICAL IMPRESSION: Pt has attended 5 visits overall reporting +5 on global rating of change with reduced symptoms to 2/10 or less in last several weeks.  Pt demonstrated progression in FOTO as noted.  Recommend trial HEP at this time due to improvements noted.  Pt was in agreement.    OBJECTIVE IMPAIRMENTS: Abnormal gait, decreased balance, difficulty walking, decreased ROM, decreased strength, hypomobility, increased fascial restrictions, impaired flexibility, postural dysfunction, obesity, and pain.   ACTIVITY LIMITATIONS: carrying, lifting, standing, squatting, transfers, reach over head, and locomotion level  PARTICIPATION LIMITATIONS: driving, shopping, community activity, and yard work  PERSONAL FACTORS: Age, Behavior pattern, Education, Fitness, Past/current experiences, and Time since onset of injury/illness/exacerbation are also affecting patient's functional outcome.   REHAB POTENTIAL: Fair chronicity of pain, limited to coming 1x/week due to distance from clinic   CLINICAL DECISION MAKING: Stable/uncomplicated  EVALUATION COMPLEXITY: Low   GOALS: Goals reviewed with patient? Yes  SHORT TERM GOALS: Target date: 10/30/2022    Will be compliant with appropriate progressive HEP  Baseline: Goal status: Met  2.  Will demonstrate good functional biomechanics for floor to waist lifting and turning with a load, also for bed mobility  Baseline:  Goal status:  Met  3.  Pain to be no more than 5/10 at worst  Baseline:  Goal status: Met 10/16/2022  4.   Will demonstrate improved postural mechanics  Baseline:  Goal status: Met 10/30/2022   LONG TERM GOALS: Target date: 11/27/2022    MMT to improve by 1 grade in all weak groups  Baseline:  Goal status: met 10/30/2022  2.  Pain to be no more than 3/10 at most  Baseline:  Goal status: 10/30/2022 Met  3.  Will score 22/24 on DGI  Baseline:  Goal status: on going 10/16/2022  4.  Will be able to stand for at least 2 hours without significant increase in pain  Baseline:  Goal status: Met   PLAN:  PT FREQUENCY: 1x/week  PT DURATION: 8 weeks  PLANNED INTERVENTIONS: Therapeutic exercises, Therapeutic activity, Neuromuscular re-education, Balance training, Gait training, Patient/Family education, Self Care, Joint mobilization, Aquatic Therapy, Dry Needling, Electrical stimulation, Spinal mobilization, Cryotherapy, Moist heat, Taping, Traction, Ultrasound, Ionotophoresis 4mg /ml Dexamethasone, Manual therapy, and Re-evaluation.  PLAN FOR NEXT SESSION:  Trial HEP period   Chyrel Masson, PT, DPT, OCS, ATC 10/30/22  8:58 AM   PHYSICAL THERAPY DISCHARGE SUMMARY  Visits from Start of Care: 5  Current functional level related to goals / functional outcomes: See note   Remaining deficits: See note   Education / Equipment: HEP  Patient goals were met. Patient is being discharged due to not returning since the last visit.  Chyrel Masson, PT, DPT, OCS, ATC 12/16/22  2:14 PM

## 2022-11-05 ENCOUNTER — Encounter: Payer: Self-pay | Admitting: Physical Medicine and Rehabilitation

## 2022-11-05 ENCOUNTER — Ambulatory Visit: Payer: PPO | Admitting: Physical Medicine and Rehabilitation

## 2022-11-05 DIAGNOSIS — M47816 Spondylosis without myelopathy or radiculopathy, lumbar region: Secondary | ICD-10-CM | POA: Diagnosis not present

## 2022-11-05 DIAGNOSIS — M545 Low back pain, unspecified: Secondary | ICD-10-CM | POA: Diagnosis not present

## 2022-11-05 DIAGNOSIS — G8929 Other chronic pain: Secondary | ICD-10-CM

## 2022-11-05 NOTE — Progress Notes (Unsigned)
Functional Pain Scale - descriptive words and definitions  Mild (2)   Noticeable when not distracted/no impact on ADL's/sleep only slightly affected and able to   use both passive and active distraction for comfort. Mild range order  Average Pain 1-2  Lower back pain. Patient feels much better

## 2022-11-05 NOTE — Progress Notes (Unsigned)
Carlos [name] - 67 y.o. male MRN [id number]  Date of birth: 08-27-1955  Office Visit Note: Visit Date: 11/05/2022 PCP: Georganna Skeans, MD Referred by: Georganna Skeans, MD  Subjective: Chief Complaint  Patient presents with   Lower Back - Pain   HPI: Carlos [name] is a 67 y.o. male who comes in today for evaluation of chronic bilateral lower back pain, right greater than left. Pain ongoing for several years, worsened over the last several months. Pain becomes severe with prolonged standing, also when moving from sitting to standing position. He describes pain as sore and aching sensation, currently denies pain. Some relief of pain with home exercise regimen, rest and use of medications.  Patient recently completed regimen of formal physical therapy with our in-house team and reports significant and sustained relief of pain with these treatments.   Recent lumbar MRI imaging exhibits chronic compression deformity involving the L3 superior endplate, multi level facet changes, most severe at L3-L4, L4-L5 and L5-S1. No high grade spinal stenosis.  Overall, patient feels much better and reports increased functional ability.  He was able to fish for several days last weekend without severe discomfort.  He continues to be active and does walk multiple times a week.  Patient denies focal weakness, numbness and tingling.  Patient denies recent trauma or falls.   Review of Systems  Musculoskeletal:  Negative for back pain.  Neurological:  Negative for tingling, sensory change, focal weakness and weakness.  All other systems reviewed and are negative.  Otherwise per HPI.  Assessment & Plan: Visit Diagnoses:    ICD-10-CM   1. Chronic bilateral low back pain without sciatica  M54.50    G89.29     2. Spondylosis without myelopathy or radiculopathy, lumbar region  M47.816     3. Facet arthropathy, lumbar  M47.816        Plan: Findings:  Chronic bilateral axial back pain, right greater than left.  Significant and sustained relief of pain with recent regimen of formal physical therapy.  Patient continues with conservative therapies as needed.  Patient's clinical presentation and exam remain consistent with facet mediated pain.  There are multilevel degenerative changes noted on recent lumbar MRI imaging, most severe at L3-L4, L4-L5 and L5-S1.  We will continue to monitor, if his pain returns or worsens we did discuss the possibility of performing facet joint injections.  If good relief of pain with facet injections we also discussed possible longer sustained pain relief with radiofrequency ablation.  I encouraged patient to continue with consistent home exercise program and to remain active as tolerated.  I instructed patient to let us know how he is doing, he can follow-up with [place] as needed.  No red flag symptoms noted upon exam today.    Meds & Orders: No orders of the defined types were placed in this encounter.  No orders of the defined types were placed in this encounter.   Follow-up: Return if symptoms worsen or fail to improve.   Procedures: No procedures performed      Clinical History: CLINICAL DATA:  Low back pain, symptoms persist with > 6 wks treatment. Numbness in the feet.   EXAM: MRI LUMBAR SPINE WITHOUT CONTRAST   TECHNIQUE: Multiplanar, multisequence MR imaging of the lumbar spine was performed. No intravenous contrast was administered.   COMPARISON:  Lumbar spine radiographs 08/14/2022   FINDINGS: Segmentation:  Standard.   Alignment:  Trace retrolisthesis of L2 on L3.   Vertebrae: Prominent Schmorl's node/chronic  compression deformity involving the L3 superior endplate. Heterogeneous bone marrow signal diffusely with patchy fatty marrow changes and hemangiomas. No suspicious marrow lesion. Mild right facet edema at L4-5.   Conus medullaris and cauda equina: Conus extends to the lower L1 level. Conus and cauda equina appear normal.   Paraspinal and other  soft tissues: Unremarkable.   Disc levels:   T12-L1: Negative.   L1-2: Disc desiccation. Tiny central disc protrusion with annular fissure. No stenosis.   L2-3: Disc desiccation and moderate disc space narrowing. Disc bulging without stenosis.   L3-4: Disc desiccation and mild disc space narrowing. Disc bulging and mild facet and ligamentum flavum hypertrophy result in mild bilateral neural foraminal stenosis without spinal stenosis.   L4-5: Mild disc bulging and mild facet hypertrophy result in minimal bilateral neural foraminal narrowing without spinal stenosis.   L5-S1: Mild facet hypertrophy without stenosis.   IMPRESSION: 1. Multilevel lumbar disc and facet degeneration without spinal stenosis. 2. Mild bilateral neural foraminal stenosis at L3-4.     Electronically Signed   By: Sebastian Ache M.D.   On: 09/19/2022 16:43   He reports that he has never smoked. He has been exposed to tobacco smoke. He has never used smokeless tobacco. No results for input(s): "HGBA1C", "LABURIC" in the last 8760 hours.  Objective:  VS:  HT:    WT:   BMI:     BP:   HR: bpm  TEMP: ( )  RESP:  Physical Exam Vitals and nursing note reviewed.  HENT:     Head: Normocephalic and atraumatic.     Right Ear: External ear normal.     Left Ear: External ear normal.     Nose: Nose normal.     Mouth/Throat:     Mouth: Mucous membranes are moist.  Eyes:     Extraocular Movements: Extraocular movements intact.  Cardiovascular:     Rate and Rhythm: Normal rate.     Pulses: Normal pulses.  Pulmonary:     Effort: Pulmonary effort is normal.  Abdominal:     General: Abdomen is flat. There is no distension.  Musculoskeletal:        General: Tenderness present.     Cervical back: Normal range of motion.     Comments: Patient rises from seated position to standing without difficulty. Concordant low back pain with facet loading, lumbar spine extension and rotation. 5/5 strength noted with  bilateral hip flexion, knee flexion/extension, ankle dorsiflexion/plantarflexion and EHL. No clonus noted bilaterally. No pain upon palpation of greater trochanters. No pain with internal/external rotation of bilateral hips. Sensation intact bilaterally. Negative slump test bilaterally. Ambulates without aid, gait steady.    Skin:    General: Skin is warm and dry.     Capillary Refill: Capillary refill takes less than 2 seconds.  Neurological:     General: No focal deficit present.     Mental Status: He is alert and oriented to person, place, and time.  Psychiatric:        Mood and Affect: Mood normal.        Behavior: Behavior normal.     Ortho Exam  Imaging: No results found.  Past Medical/Family/Surgical/Social History: Medications & Allergies reviewed per EMR, new medications updated. Patient Active Problem List   Diagnosis Date Noted   Hyperlipidemia 07/20/2019   Elevated PSA 06/04/2017   Hypogonadism in male 09/15/2007   Essential hypertension 09/15/2007   DIVERTICULITIS OF COLON 09/15/2007   Past Medical History:  Diagnosis Date  Hyperlipidemia    Hypertension    Low testosterone    Pre-diabetes 06/22/2018   03/15/21- pt states not prediabetic as of now   Family History  Problem Relation Age of Onset   Cancer Mother        "male"   Lung cancer Father    Bladder Cancer Sister    Colon cancer Neg Hx    Colon polyps Neg Hx    Esophageal cancer Neg Hx    Rectal cancer Neg Hx    Stomach cancer Neg Hx    Past Surgical History:  Procedure Laterality Date   COLONOSCOPY  06/20/2017   COLONOSCOPY WITH PROPOFOL  03/29/2021   MENISCUS REPAIR     ORIF TIBIA PLATEAU Left 11/09/2015   Procedure: OPEN REDUCTION INTERNAL (ORIF) FIXATION LEFT LATERAL TIBIAL PLATEAU;  Surgeon: Francena Hanly, MD;  Location: [facility] OR;  Service: Orthopedics;  Laterality: Left;   ROTATOR CUFF REPAIR Right 2002   VASECTOMY     Social History   Occupational History   Not on file  Tobacco Use    Smoking status: Never    Passive exposure: Past (as a child)   Smokeless tobacco: Never  Vaping Use   Vaping Use: Never used  Substance and Sexual Activity   Alcohol use: Yes    Alcohol/week: 4.0 standard drinks of alcohol    Types: 1 Glasses of wine, 1 Shots of liquor, 2 Standard drinks or equivalent per week   Drug use: Never   Sexual activity: Yes    Birth control/protection: Surgical

## 2022-11-06 ENCOUNTER — Encounter: Payer: PPO | Admitting: Rehabilitative and Restorative Service Providers"

## 2022-11-13 ENCOUNTER — Encounter: Payer: PPO | Admitting: Rehabilitative and Restorative Service Providers"

## 2022-11-20 ENCOUNTER — Encounter: Payer: PPO | Admitting: Physical Therapy

## 2023-02-05 ENCOUNTER — Ambulatory Visit (INDEPENDENT_AMBULATORY_CARE_PROVIDER_SITE_OTHER): Payer: PPO

## 2023-02-05 DIAGNOSIS — Z0000 Encounter for general adult medical examination without abnormal findings: Secondary | ICD-10-CM | POA: Diagnosis not present

## 2023-02-05 NOTE — Progress Notes (Signed)
Subjective:   Carlos [name] is a 67 y.o. male who presents for Medicare Annual/Subsequent preventive examination.  Visit Complete: Virtual  I connected with  Carlos [name] on 02/05/23 by a audio enabled telemedicine application and verified that I am speaking with the correct person using two identifiers.  Patient Location: Home  Provider Location: Office/Clinic  I discussed the limitations of evaluation and management by telemedicine. The patient expressed understanding and agreed to proceed.  Vital Signs: Unable to obtain new vitals due to this being a telehealth visit.  Review of Systems     Cardiac Risk Factors include: advanced age (>28men, >26 women);dyslipidemia;hypertension;male gender     Objective:    Today's Vitals   There is no height or weight on file to calculate BMI.     02/05/2023   10:38 AM 10/02/2022    8:49 AM 12/01/2020    3:47 PM  Advanced Directives  Does Patient Have a Medical Advance Directive? Yes No   Type of Estate agent of [place];Living will    Copy of Healthcare Power of Attorney in Chart? No - copy requested    Would patient like information on creating a medical advance directive?  No - Patient declined No - Patient declined    Current Medications (verified) Outpatient Encounter Medications as of 02/05/2023  Medication Sig   atorvastatin (LIPITOR) 80 MG tablet Take 1 tablet (80 mg total) by mouth daily.   hydrochlorothiazide (HYDRODIURIL) 25 MG tablet Take 1 tablet (25 mg total) by mouth daily.   losartan (COZAAR) 50 MG tablet Take 1 tablet (50 mg total) by mouth daily.   Omega-3 Fatty Acids (FISH OIL OMEGA-3 PO) Take by mouth.   sildenafil (VIAGRA) 100 MG tablet Take 100 mg by mouth daily.   testosterone cypionate (DEPOTESTOSTERONE CYPIONATE) 200 MG/ML injection SMARTSIG:0.5 Milliliter(s) IM Once a Week   polyethylene glycol-electrolytes (NULYTELY) 420 g solution Take by mouth. (Patient not taking: Reported on  02/05/2023)   No facility-administered encounter medications on file as of 02/05/2023.    Allergies (verified) Sulfa antibiotics   History: Past Medical History:  Diagnosis Date   Hyperlipidemia    Hypertension    Low testosterone    Pre-diabetes 06/22/2018   03/15/21- pt states not prediabetic as of now   Past Surgical History:  Procedure Laterality Date   COLONOSCOPY  06/20/2017   COLONOSCOPY WITH PROPOFOL  03/29/2021   MENISCUS REPAIR     ORIF TIBIA PLATEAU Left 11/09/2015   Procedure: OPEN REDUCTION INTERNAL (ORIF) FIXATION LEFT LATERAL TIBIAL PLATEAU;  Surgeon: Francena Hanly, MD;  Location: [facility] OR;  Service: Orthopedics;  Laterality: Left;   ROTATOR CUFF REPAIR Right 2002   VASECTOMY     Family History  Problem Relation Age of Onset   Cancer Mother        "male"   Lung cancer Father    Bladder Cancer Sister    Colon cancer Neg Hx    Colon polyps Neg Hx    Esophageal cancer Neg Hx    Rectal cancer Neg Hx    Stomach cancer Neg Hx    Social History   Socioeconomic History   Marital status: Married    Spouse name: Not on file   Number of children: Not on file   Years of education: Not on file   Highest education level: Not on file  Occupational History   Not on file  Tobacco Use   Smoking status: Never    Passive exposure:  Past (as a child)   Smokeless tobacco: Never  Vaping Use   Vaping status: Never Used  Substance and Sexual Activity   Alcohol use: Yes    Alcohol/week: 4.0 standard drinks of alcohol    Types: 1 Glasses of wine, 1 Shots of liquor, 2 Standard drinks or equivalent per week   Drug use: Never   Sexual activity: Yes    Birth control/protection: Surgical  Other Topics Concern   Not on file  Social History Narrative   Not on file   Social Determinants of Health   Financial Resource Strain: Low Risk  (02/05/2023)   Overall Financial Resource Strain (CARDIA)    Difficulty of Paying Living Expenses: Not hard at all  Food Insecurity: No  Food Insecurity (02/05/2023)   Hunger Vital Sign    Worried About Running Out of Food in the Last Year: Never true    Ran Out of Food in the Last Year: Never true  Transportation Needs: No Transportation Needs (02/05/2023)   PRAPARE - Administrator, Civil Service (Medical): No    Lack of Transportation (Non-Medical): No  Physical Activity: Sufficiently Active (02/05/2023)   Exercise Vital Sign    Days of Exercise per Week: 5 days    Minutes of Exercise per Session: 50 min  Stress: No Stress Concern Present (02/05/2023)   [employer] of Occupational Health - Occupational Stress Questionnaire    Feeling of Stress : Not at all  Social Connections: Socially Integrated (02/05/2023)   Social Connection and Isolation Panel [NHANES]    Frequency of Communication with Friends and Family: More than three times a week    Frequency of Social Gatherings with Friends and Family: More than three times a week    Attends Religious Services: More than 4 times per year    Active Member of [employer] or Organizations: Yes    Attends Engineer, structural: More than 4 times per year    Marital Status: Married    Tobacco Counseling Counseling given: Not Answered   Clinical Intake:  Pre-visit preparation completed: Yes  Pain : No/denies pain     Nutritional Risks: None Diabetes: No  How often do you need to have someone help you when you read instructions, pamphlets, or other written materials from your doctor or pharmacy?: 1 - Never  Interpreter Needed?: No  Information entered by :: NAllen LPN   Activities of Daily Living    02/05/2023   10:32 AM 05/06/2022    8:29 PM  In your present state of health, do you have any difficulty performing the following activities:  Hearing? 0 0  Vision? 0 0  Difficulty concentrating or making decisions? 0 0  Walking or climbing stairs? 0 0  Dressing or bathing? 0 0  Doing errands, shopping? 0 0  Preparing Food and eating ? N N   Using the Toilet? N N  In the past six months, have you accidently leaked urine? N Y  Do you have problems with loss of bowel control? N N  Managing your Medications? N N  Managing your Finances? N N  Housekeeping or managing your Housekeeping? N N    Patient Care Team: Georganna Skeans, MD as PCP - General (Family Medicine)  Indicate any recent Medical Services you may have received from other than [facility] providers in the past year (date may be approximate).     Assessment:   This is a routine wellness examination for Carlos [name].  Hearing/Vision screen Hearing  Screening - Comments:: Denies hearing issues Vision Screening - Comments:: Regular eye exams, Dr. Su Hilt  Dietary issues and exercise activities discussed:     Goals Addressed             This Visit's Progress    Patient Stated       02/05/2023, stay alive       Depression Screen    02/05/2023   10:41 AM 05/10/2022   11:09 AM 02/13/2022    3:19 PM 05/14/2021   10:20 AM 11/15/2020    8:59 AM 07/16/2019    8:45 AM 06/22/2018    4:24 PM  PHQ 2/9 Scores  PHQ - 2 Score 0 0 0 0 0 0 0  PHQ- 9 Score 0  0  0  1    Fall Risk    02/05/2023   10:39 AM 08/14/2022    2:22 PM 05/06/2022    8:29 PM 02/13/2022    3:25 PM 11/15/2020    8:58 AM  Fall Risk   Falls in the past year? 0 0 0 0 0  Number falls in past yr: 0 0 0  0  Injury with Fall? 0 0 0 0 0  Risk for fall due to : Medication side effect    No Fall Risks  Follow up Falls prevention discussed;Falls evaluation completed    Falls evaluation completed    MEDICARE RISK AT HOME:  Medicare Risk at Home - 02/05/23 1040     Any stairs in or around the home? Yes    If so, are there any without handrails? No    Home free of loose throw rugs in walkways, pet beds, electrical cords, etc? Yes    Adequate lighting in your home to reduce risk of falls? Yes    Life alert? No    Use of a cane, walker or w/c? No    Grab bars in the bathroom? Yes    Shower chair or bench in  shower? Yes    Elevated toilet seat or a handicapped toilet? Yes             TIMED UP AND GO:  Was the test performed?  No    Cognitive Function:        02/05/2023   10:43 AM 05/10/2022   11:14 AM  6CIT Screen  What Year? 0 points 0 points  What month? 0 points 0 points  What time? 0 points 0 points  Count back from 20 0 points 0 points  Months in reverse 0 points 0 points  Repeat phrase 0 points 0 points  Total Score 0 points 0 points    Immunizations Immunization History  Administered Date(s) Administered   Janssen (J&J) SARS-COV-2 Vaccination 11/17/2019   Tdap 06/24/2014    TDAP status: Up to date  Flu Vaccine status: Due, Education has been provided regarding the importance of this vaccine. Advised may receive this vaccine at local pharmacy or Health Dept. Aware to provide a copy of the vaccination record if obtained from local pharmacy or Health Dept. Verbalized acceptance and understanding.  Pneumococcal vaccine status: Declined,  Education has been provided regarding the importance of this vaccine but patient still declined. Advised may receive this vaccine at local pharmacy or Health Dept. Aware to provide a copy of the vaccination record if obtained from local pharmacy or Health Dept. Verbalized acceptance and understanding.   Covid-19 vaccine status: Information provided on how to obtain vaccines.   Qualifies for Shingles Vaccine? Yes  Zostavax completed No   Shingrix Completed?: No.    Education has been provided regarding the importance of this vaccine. Patient has been advised to call insurance company to determine out of pocket expense if they have not yet received this vaccine. Advised may also receive vaccine at local pharmacy or Health Dept. Verbalized acceptance and understanding.  Screening Tests Health Maintenance  Topic Date Due   COVID-19 Vaccine (2 - 2023-24 season) 02/22/2022   INFLUENZA VACCINE  01/23/2023   Pneumonia Vaccine 65+ Years  old (1 of 1 - PCV) 02/14/2023 (Originally 06/26/2019)   Medicare Annual Wellness (AWV)  02/05/2024   DTaP/Tdap/Td (2 - Td or Tdap) 06/24/2024   Colonoscopy  03/29/2026   Hepatitis C Screening  Completed   HPV VACCINES  Aged Out   Zoster Vaccines- Shingrix  Discontinued    Health Maintenance  Health Maintenance Due  Topic Date Due   COVID-19 Vaccine (2 - 2023-24 season) 02/22/2022   INFLUENZA VACCINE  01/23/2023    Colorectal cancer screening: Type of screening: Colonoscopy. Completed 03/29/2021. Repeat every 5 years  Lung Cancer Screening: (Low Dose CT Chest recommended if Age 21-80 years, 20 pack-year currently smoking OR have quit w/in 15years.) does not qualify.   Lung Cancer Screening Referral: no  Additional Screening:  Hepatitis C Screening: does qualify; Completed 07/16/2019  Vision Screening: Recommended annual ophthalmology exams for early detection of glaucoma and other disorders of the eye. Is the patient up to date with their annual eye exam?  Yes  Who is the provider or what is the name of the office in which the patient attends annual eye exams? Dr. Su Hilt If pt is not established with a provider, would they like to be referred to a provider to establish care? No .   Dental Screening: Recommended annual dental exams for proper oral hygiene  Diabetic Foot Exam: n/a  Community Resource Referral / Chronic Care Management: CRR required this visit?  No   CCM required this visit?  No     Plan:     I have personally reviewed and noted the following in the patient's chart:   Medical and social history Use of alcohol, tobacco or illicit drugs  Current medications and supplements including opioid prescriptions. Patient is not currently taking opioid prescriptions. Functional ability and status Nutritional status Physical activity Advanced directives List of other physicians Hospitalizations, surgeries, and ER visits in previous 12 months Vitals Screenings to  include cognitive, depression, and falls Referrals and appointments  In addition, I have reviewed and discussed with patient certain preventive protocols, quality metrics, and best practice recommendations. A written personalized care plan for preventive services as well as general preventive health recommendations were provided to patient.     Barb Merino, LPN   1/61/[id number]   After Visit Summary: (MyChart) Due to this being a telephonic visit, the after visit summary with patients personalized plan was offered to patient via MyChart   Nurse Notes: none

## 2023-02-05 NOTE — Patient Instructions (Signed)
Mr. Carlos [name] , Thank you for taking time to come for your Medicare Wellness Visit. I appreciate your ongoing commitment to your health goals. Please review the following plan we discussed and let me know if I can assist you in the future.   Referrals/Orders/Follow-Ups/Clinician Recommendations: none  This is a list of the screening recommended for you and due dates:  Health Maintenance  Topic Date Due   COVID-19 Vaccine (2 - 2023-24 season) 02/22/2022   Flu Shot  01/23/2023   Pneumonia Vaccine (1 of 1 - PCV) 02/14/2023*   Medicare Annual Wellness Visit  02/05/2024   DTaP/Tdap/Td vaccine (2 - Td or Tdap) 06/24/2024   Colon Cancer Screening  03/29/2026   Hepatitis C Screening  Completed   HPV Vaccine  Aged Out   Zoster (Shingles) Vaccine  Discontinued  *Topic was postponed. The date shown is not the original due date.    Advanced directives: (Copy Requested) Please bring a copy of your health care power of attorney and living will to the office to be added to your chart at your convenience.  Next Medicare Annual Wellness Visit scheduled for next year: No, schedule not open for next year  Preventive Care 9 Years and Older, Male  Preventive care refers to lifestyle choices and visits with your health care provider that can promote health and wellness. What does preventive care include? A yearly physical exam. This is also called an annual well check. Dental exams once or twice a year. Routine eye exams. Ask your health care provider how often you should have your eyes checked. Personal lifestyle choices, including: Daily care of your teeth and gums. Regular physical activity. Eating a healthy diet. Avoiding tobacco and drug use. Limiting alcohol use. Practicing safe sex. Taking low doses of aspirin every day. Taking vitamin and mineral supplements as recommended by your health care provider. What happens during an annual well check? The services and screenings done by your health  care provider during your annual well check will depend on your age, overall health, lifestyle risk factors, and family history of disease. Counseling  Your health care provider may ask you questions about your: Alcohol use. Tobacco use. Drug use. Emotional well-being. Home and relationship well-being. Sexual activity. Eating habits. History of falls. Memory and ability to understand (cognition). Work and work Astronomer. Screening  You may have the following tests or measurements: Height, weight, and BMI. Blood pressure. Lipid and cholesterol levels. These may be checked every 5 years, or more frequently if you are over 17 years old. Skin check. Lung cancer screening. You may have this screening every year starting at age 40 if you have a 30-pack-year history of smoking and currently smoke or have quit within the past 15 years. Fecal occult blood test (FOBT) of the stool. You may have this test every year starting at age 54. Flexible sigmoidoscopy or colonoscopy. You may have a sigmoidoscopy every 5 years or a colonoscopy every 10 years starting at age 45. Prostate cancer screening. Recommendations will vary depending on your family history and other risks. Hepatitis C blood test. Hepatitis B blood test. Sexually transmitted disease (STD) testing. Diabetes screening. This is done by checking your blood sugar (glucose) after you have not eaten for a while (fasting). You may have this done every 1-3 years. Abdominal aortic aneurysm (AAA) screening. You may need this if you are a current or former smoker. Osteoporosis. You may be screened starting at age 15 if you are at high risk. Talk with  your health care provider about your test results, treatment options, and if necessary, the need for more tests. Vaccines  Your health care provider may recommend certain vaccines, such as: Influenza vaccine. This is recommended every year. Tetanus, diphtheria, and acellular pertussis (Tdap, Td)  vaccine. You may need a Td booster every 10 years. Zoster vaccine. You may need this after age 2. Pneumococcal 13-valent conjugate (PCV13) vaccine. One dose is recommended after age 73. Pneumococcal polysaccharide (PPSV23) vaccine. One dose is recommended after age 79. Talk to your health care provider about which screenings and vaccines you need and how often you need them. This information is not intended to replace advice given to you by your health care provider. Make sure you discuss any questions you have with your health care provider. Document Released: 07/07/2015 Document Revised: 02/28/2016 Document Reviewed: 04/11/2015 Elsevier Interactive Patient Education  2017 [employer].  Fall Prevention in the Home Falls can cause injuries. They can happen to people of all ages. There are many things you can do to make your home safe and to help prevent falls. What can I do on the outside of my home? Regularly fix the edges of walkways and driveways and fix any cracks. Remove anything that might make you trip as you walk through a door, such as a raised step or threshold. Trim any bushes or trees on the path to your home. Use bright outdoor lighting. Clear any walking paths of anything that might make someone trip, such as rocks or tools. Regularly check to see if handrails are loose or broken. Make sure that both sides of any steps have handrails. Any raised decks and porches should have guardrails on the edges. Have any leaves, snow, or ice cleared regularly. Use sand or salt on walking paths during winter. Clean up any spills in your garage right away. This includes oil or grease spills. What can I do in the bathroom? Use night lights. Install grab bars by the toilet and in the tub and shower. Do not use towel bars as grab bars. Use non-skid mats or decals in the tub or shower. If you need to sit down in the shower, use a plastic, non-slip stool. Keep the floor dry. Clean up any  water that spills on the floor as soon as it happens. Remove soap buildup in the tub or shower regularly. Attach bath mats securely with double-sided non-slip rug tape. Do not have throw rugs and other things on the floor that can make you trip. What can I do in the bedroom? Use night lights. Make sure that you have a light by your bed that is easy to reach. Do not use any sheets or blankets that are too big for your bed. They should not hang down onto the floor. Have a firm chair that has side arms. You can use this for support while you get dressed. Do not have throw rugs and other things on the floor that can make you trip. What can I do in the kitchen? Clean up any spills right away. Avoid walking on wet floors. Keep items that you use a lot in easy-to-reach places. If you need to reach something above you, use a strong step stool that has a grab bar. Keep electrical cords out of the way. Do not use floor polish or wax that makes floors slippery. If you must use wax, use non-skid floor wax. Do not have throw rugs and other things on the floor that can make you trip.  What can I do with my stairs? Do not leave any items on the stairs. Make sure that there are handrails on both sides of the stairs and use them. Fix handrails that are broken or loose. Make sure that handrails are as long as the stairways. Check any carpeting to make sure that it is firmly attached to the stairs. Fix any carpet that is loose or worn. Avoid having throw rugs at the top or bottom of the stairs. If you do have throw rugs, attach them to the floor with carpet tape. Make sure that you have a light switch at the top of the stairs and the bottom of the stairs. If you do not have them, ask someone to add them for you. What else can I do to help prevent falls? Wear shoes that: Do not have high heels. Have rubber bottoms. Are comfortable and fit you well. Are closed at the toe. Do not wear sandals. If you use a  stepladder: Make sure that it is fully opened. Do not climb a closed stepladder. Make sure that both sides of the stepladder are locked into place. Ask someone to hold it for you, if possible. Clearly mark and make sure that you can see: Any grab bars or handrails. First and last steps. Where the edge of each step is. Use tools that help you move around (mobility aids) if they are needed. These include: Canes. Walkers. Scooters. Crutches. Turn on the lights when you go into a dark area. Replace any light bulbs as soon as they burn out. Set up your furniture so you have a clear path. Avoid moving your furniture around. If any of your floors are uneven, fix them. If there are any pets around you, be aware of where they are. Review your medicines with your doctor. Some medicines can make you feel dizzy. This can increase your chance of falling. Ask your doctor what other things that you can do to help prevent falls. This information is not intended to replace advice given to you by your health care provider. Make sure you discuss any questions you have with your health care provider. Document Released: 04/06/2009 Document Revised: 11/16/2015 Document Reviewed: 07/15/2014 Elsevier Interactive Patient Education  2017 [employer].

## 2023-02-17 ENCOUNTER — Encounter: Payer: Self-pay | Admitting: Family Medicine

## 2023-02-17 ENCOUNTER — Ambulatory Visit (INDEPENDENT_AMBULATORY_CARE_PROVIDER_SITE_OTHER): Payer: PPO | Admitting: Family Medicine

## 2023-02-17 VITALS — BP 147/99 | HR 71 | Temp 97.7°F | Resp 18 | Ht 72.0 in | Wt 270.4 lb

## 2023-02-17 DIAGNOSIS — E785 Hyperlipidemia, unspecified: Secondary | ICD-10-CM

## 2023-02-17 DIAGNOSIS — I10 Essential (primary) hypertension: Secondary | ICD-10-CM | POA: Diagnosis not present

## 2023-02-17 MED ORDER — ATORVASTATIN CALCIUM 80 MG PO TABS: 80.0000 mg | ORAL_TABLET | Freq: Every day | ORAL | 1 refills | Status: DC

## 2023-02-17 MED ORDER — LOSARTAN POTASSIUM 100 MG PO TABS
100.0000 mg | ORAL_TABLET | Freq: Every day | ORAL | 1 refills | Status: DC
Start: 2023-02-17 — End: 2023-09-15

## 2023-02-17 MED ORDER — HYDROCHLOROTHIAZIDE 25 MG PO TABS
25.0000 mg | ORAL_TABLET | Freq: Every day | ORAL | 1 refills | Status: DC
Start: 2023-02-17 — End: 2023-09-15

## 2023-02-17 NOTE — Progress Notes (Signed)
Established Patient Office Visit  Subjective    Patient ID: Carlos [name], male    DOB: 1956/03/02  Age: 67 y.o. MRN: [id number]  CC:  Chief Complaint  Patient presents with   Follow-up    No concerns  may need lab work    HPI Carlos [name] presents for routine follow up of chronic med issues. Patient denies acute complaints or concerns.    Outpatient Encounter Medications as of 02/17/2023  Medication Sig   atorvastatin (LIPITOR) 80 MG tablet Take 1 tablet (80 mg total) by mouth daily.   hydrochlorothiazide (HYDRODIURIL) 25 MG tablet Take 1 tablet (25 mg total) by mouth daily.   losartan (COZAAR) 50 MG tablet Take 1 tablet (50 mg total) by mouth daily.   Omega-3 Fatty Acids (FISH OIL OMEGA-3 PO) Take by mouth.   polyethylene glycol-electrolytes (NULYTELY) 420 g solution Take by mouth. (Patient not taking: Reported on 02/05/2023)   sildenafil (VIAGRA) 100 MG tablet Take 100 mg by mouth daily.   testosterone cypionate (DEPOTESTOSTERONE CYPIONATE) 200 MG/ML injection SMARTSIG:0.5 Milliliter(s) IM Once a Week   No facility-administered encounter medications on file as of 02/17/2023.    Past Medical History:  Diagnosis Date   Hyperlipidemia    Hypertension    Low testosterone    Pre-diabetes 06/22/2018   03/15/21- pt states not prediabetic as of now    Past Surgical History:  Procedure Laterality Date   COLONOSCOPY  06/20/2017   COLONOSCOPY WITH PROPOFOL  03/29/2021   MENISCUS REPAIR     ORIF TIBIA PLATEAU Left 11/09/2015   Procedure: OPEN REDUCTION INTERNAL (ORIF) FIXATION LEFT LATERAL TIBIAL PLATEAU;  Surgeon: Francena Hanly, MD;  Location: [facility] OR;  Service: Orthopedics;  Laterality: Left;   ROTATOR CUFF REPAIR Right 2002   VASECTOMY      Family History  Problem Relation Age of Onset   Cancer Mother        "male"   Lung cancer Father    Bladder Cancer Sister    Colon cancer Neg Hx    Colon polyps Neg Hx    Esophageal cancer Neg Hx    Rectal cancer Neg Hx     Stomach cancer Neg Hx     Social History   Socioeconomic History   Marital status: Married    Spouse name: Not on file   Number of children: Not on file   Years of education: Not on file   Highest education level: Not on file  Occupational History   Not on file  Tobacco Use   Smoking status: Never    Passive exposure: Past (as a child)   Smokeless tobacco: Never  Vaping Use   Vaping status: Never Used  Substance and Sexual Activity   Alcohol use: Yes    Alcohol/week: 4.0 standard drinks of alcohol    Types: 1 Glasses of wine, 1 Shots of liquor, 2 Standard drinks or equivalent per week   Drug use: Never   Sexual activity: Yes    Birth control/protection: Surgical  Other Topics Concern   Not on file  Social History Narrative   Not on file   Social Determinants of Health   Financial Resource Strain: Low Risk  (02/05/2023)   Overall Financial Resource Strain (CARDIA)    Difficulty of Paying Living Expenses: Not hard at all  Food Insecurity: No Food Insecurity (02/05/2023)   Hunger Vital Sign    Worried About Running Out of Food in the Last Year: Never true  Ran Out of Food in the Last Year: Never true  Transportation Needs: No Transportation Needs (02/05/2023)   PRAPARE - Administrator, Civil Service (Medical): No    Lack of Transportation (Non-Medical): No  Physical Activity: Sufficiently Active (02/05/2023)   Exercise Vital Sign    Days of Exercise per Week: 5 days    Minutes of Exercise per Session: 50 min  Stress: No Stress Concern Present (02/05/2023)   [employer] of Occupational Health - Occupational Stress Questionnaire    Feeling of Stress : Not at all  Social Connections: Socially Integrated (02/05/2023)   Social Connection and Isolation Panel [NHANES]    Frequency of Communication with Friends and Family: More than three times a week    Frequency of Social Gatherings with Friends and Family: More than three times a week    Attends Religious  Services: More than 4 times per year    Active Member of [employer] or Organizations: Yes    Attends Banker Meetings: More than 4 times per year    Marital Status: Married  Catering manager Violence: Not At Risk (02/05/2023)   Humiliation, Afraid, Rape, and Kick questionnaire    Fear of Current or Ex-Partner: No    Emotionally Abused: No    Physically Abused: No    Sexually Abused: No    Review of Systems  All other systems reviewed and are negative.       Objective    BP (!) 147/99 (BP Location: Right Arm, Patient Position: Sitting, Cuff Size: Large)   Pulse 71   Temp 97.7 F (36.5 C) (Oral)   Resp 18   Ht 6' (1.829 m)   Wt 270 lb 6.4 oz (122.7 kg)   SpO2 95%   BMI 36.67 kg/m   Physical Exam Vitals and nursing note reviewed.  Constitutional:      General: He is not in acute distress. Cardiovascular:     Rate and Rhythm: Normal rate and regular rhythm.  Pulmonary:     Effort: Pulmonary effort is normal.     Breath sounds: Normal breath sounds.  Abdominal:     Palpations: Abdomen is soft.     Tenderness: There is no abdominal tenderness.  Musculoskeletal:     Right lower leg: No edema.     Left lower leg: No edema.  Neurological:     General: No focal deficit present.     Mental Status: He is alert and oriented to person, place, and time.         Assessment & Plan:   1. Essential hypertension Persistent mildly elevated readings. Will increase losartan to 100mg  dail. Monitoring labs ordered - hydrochlorothiazide (HYDRODIURIL) 25 MG tablet; Take 1 tablet (25 mg total) by mouth daily.  Dispense: 90 tablet; Refill: 1  2. Hyperlipidemia, unspecified hyperlipidemia type continue - atorvastatin (LIPITOR) 80 MG tablet; Take 1 tablet (80 mg total) by mouth daily.  Dispense: 90 tablet; Refill: 1    Return in about 6 months (around 08/20/2023) for physical.   [name], MD

## 2023-02-17 NOTE — Progress Notes (Signed)
Follow up.  No concerns possible lab work

## 2023-02-21 LAB — LIPID PANEL
Chol/HDL Ratio: 8 ratio — ABNORMAL HIGH (ref 0.0–5.0)
Cholesterol, Total: 303 mg/dL — ABNORMAL HIGH (ref 100–199)
HDL: 38 mg/dL — ABNORMAL LOW (ref >39)
LDL Chol Calc (NIH): 223 mg/dL — ABNORMAL HIGH (ref 0–99)
Triglycerides: 213 mg/dL — ABNORMAL HIGH (ref 0–149)
VLDL Cholesterol Cal: 42 mg/dL — ABNORMAL HIGH (ref 5–40)

## 2023-02-21 LAB — CMP14+EGFR
ALT: 17 IU/L (ref 0–44)
AST: 31 IU/L (ref 0–40)
Albumin: 4.6 g/dL (ref 3.9–4.9)
Alkaline Phosphatase: 107 IU/L (ref 44–121)
BUN/Creatinine Ratio: 18 (ref 10–24)
BUN: 22 mg/dL (ref 8–27)
Bilirubin Total: 1 mg/dL (ref 0.0–1.2)
CO2: 21 mmol/L (ref 20–29)
Calcium: 10.1 mg/dL (ref 8.6–10.2)
Chloride: 99 mmol/L (ref 96–106)
Creatinine, Ser: 1.23 mg/dL (ref 0.76–1.27)
Globulin, Total: 2.6 g/dL (ref 1.5–4.5)
Glucose: 71 mg/dL (ref 70–99)
Potassium: 4.6 mmol/L (ref 3.5–5.2)
Sodium: 140 mmol/L (ref 134–144)
Total Protein: 7.2 g/dL (ref 6.0–8.5)
eGFR: 64 mL/min/{1.73_m2} (ref >59)

## 2023-02-21 LAB — SPECIMEN STATUS REPORT

## 2023-08-20 ENCOUNTER — Encounter: Payer: PPO | Admitting: Family Medicine

## 2023-08-25 ENCOUNTER — Encounter: Payer: PPO | Admitting: Family Medicine

## 2023-09-15 ENCOUNTER — Other Ambulatory Visit: Payer: Self-pay | Admitting: Family Medicine

## 2023-09-15 DIAGNOSIS — I10 Essential (primary) hypertension: Secondary | ICD-10-CM

## 2023-09-17 ENCOUNTER — Ambulatory Visit (INDEPENDENT_AMBULATORY_CARE_PROVIDER_SITE_OTHER): Payer: PPO | Admitting: Family Medicine

## 2023-09-17 VITALS — BP 130/85 | HR 79 | Temp 98.1°F | Resp 16 | Ht 72.0 in | Wt 274.4 lb

## 2023-09-17 DIAGNOSIS — Z0000 Encounter for general adult medical examination without abnormal findings: Secondary | ICD-10-CM

## 2023-09-17 DIAGNOSIS — Z130 Encounter for screening for diseases of the blood and blood-forming organs and certain disorders involving the immune mechanism: Secondary | ICD-10-CM

## 2023-09-17 DIAGNOSIS — Z13220 Encounter for screening for lipoid disorders: Secondary | ICD-10-CM

## 2023-09-17 NOTE — Progress Notes (Unsigned)
 -  Patient is here to have annually  complete physical examination  -Care gap address -labs taken

## 2023-09-18 ENCOUNTER — Encounter: Payer: Self-pay | Admitting: Family Medicine

## 2023-09-18 LAB — CBC WITH DIFFERENTIAL/PLATELET
Basophils Absolute: 0.1 10*3/uL (ref 0.0–0.2)
Basos: 1 %
EOS (ABSOLUTE): 0.1 10*3/uL (ref 0.0–0.4)
Eos: 1 %
Hematocrit: 50.1 % (ref 37.5–51.0)
Hemoglobin: 16.7 g/dL (ref 13.0–17.7)
Immature Grans (Abs): 0 10*3/uL (ref 0.0–0.1)
Immature Granulocytes: 0 %
Lymphocytes Absolute: 1.4 10*3/uL (ref 0.7–3.1)
Lymphs: 20 %
MCH: 31.3 pg (ref 26.6–33.0)
MCHC: 33.3 g/dL (ref 31.5–35.7)
MCV: 94 fL (ref 79–97)
Monocytes Absolute: 0.8 10*3/uL (ref 0.1–0.9)
Monocytes: 11 %
Neutrophils Absolute: 4.8 10*3/uL (ref 1.4–7.0)
Neutrophils: 67 %
Platelets: 239 10*3/uL (ref 150–450)
RBC: 5.33 x10E6/uL (ref 4.14–5.80)
RDW: 12.7 % (ref 11.6–15.4)
WBC: 7.2 10*3/uL (ref 3.4–10.8)

## 2023-09-18 LAB — LIPID PANEL
Chol/HDL Ratio: 4.6 ratio (ref 0.0–5.0)
Cholesterol, Total: 162 mg/dL (ref 100–199)
HDL: 35 mg/dL — ABNORMAL LOW (ref >39)
LDL Chol Calc (NIH): 89 mg/dL (ref 0–99)
Triglycerides: 226 mg/dL — ABNORMAL HIGH (ref 0–149)
VLDL Cholesterol Cal: 38 mg/dL (ref 5–40)

## 2023-09-18 LAB — CMP14+EGFR
ALT: 23 IU/L (ref 0–44)
AST: 37 IU/L (ref 0–40)
Albumin: 4.6 g/dL (ref 3.9–4.9)
Alkaline Phosphatase: 159 IU/L — ABNORMAL HIGH (ref 44–121)
BUN/Creatinine Ratio: 18 (ref 10–24)
BUN: 20 mg/dL (ref 8–27)
Bilirubin Total: 0.7 mg/dL (ref 0.0–1.2)
CO2: 25 mmol/L (ref 20–29)
Calcium: 10.3 mg/dL — ABNORMAL HIGH (ref 8.6–10.2)
Chloride: 101 mmol/L (ref 96–106)
Creatinine, Ser: 1.1 mg/dL (ref 0.76–1.27)
Globulin, Total: 2.6 g/dL (ref 1.5–4.5)
Glucose: 100 mg/dL — ABNORMAL HIGH (ref 70–99)
Potassium: 4.9 mmol/L (ref 3.5–5.2)
Sodium: 140 mmol/L (ref 134–144)
Total Protein: 7.2 g/dL (ref 6.0–8.5)
eGFR: 73 mL/min/{1.73_m2} (ref >59)

## 2023-09-18 NOTE — Progress Notes (Signed)
 Established Patient Office Visit  Subjective    Patient ID: Carlos [name], male    DOB: [birth year]  Age: 68 y.o. MRN: [id number]  CC:  Chief Complaint  Patient presents with   Annual Exam    HPI Carlos [name] presents for routine annual exam. Patient denies acute complaints.   Outpatient Encounter Medications as of 09/17/2023  Medication Sig   atorvastatin (LIPITOR) 80 MG tablet Take 1 tablet (80 mg total) by mouth daily.   hydrochlorothiazide (HYDRODIURIL) 25 MG tablet TAKE 1 TABLET BY MOUTH DAILY   losartan (COZAAR) 100 MG tablet TAKE 1 TABLET BY MOUTH DAILY   losartan (COZAAR) 50 MG tablet Take 1 tablet (50 mg total) by mouth daily.   Omega-3 Fatty Acids (FISH OIL OMEGA-3 PO) Take by mouth.   sildenafil (VIAGRA) 100 MG tablet Take 100 mg by mouth daily.   testosterone cypionate (DEPOTESTOSTERONE CYPIONATE) 200 MG/ML injection SMARTSIG:0.5 Milliliter(s) IM Once a Week   [DISCONTINUED] polyethylene glycol-electrolytes (NULYTELY) 420 g solution Take by mouth. (Patient not taking: Reported on 02/05/2023)   No facility-administered encounter medications on file as of 09/17/2023.    Past Medical History:  Diagnosis Date   Hyperlipidemia    Hypertension    Low testosterone    Pre-diabetes 06/22/2018   03/15/21- pt states not prediabetic as of now    Past Surgical History:  Procedure Laterality Date   COLONOSCOPY  06/20/2017   COLONOSCOPY WITH PROPOFOL  03/29/2021   MENISCUS REPAIR     ORIF TIBIA PLATEAU Left 11/09/2015   Procedure: OPEN REDUCTION INTERNAL (ORIF) FIXATION LEFT LATERAL TIBIAL PLATEAU;  Surgeon: Francena Hanly, MD;  Location: [facility] OR;  Service: Orthopedics;  Laterality: Left;   ROTATOR CUFF REPAIR Right 2002   VASECTOMY      Family History  Problem Relation Age of Onset   Cancer Mother        "male"   Lung cancer Father    Bladder Cancer Sister    Colon cancer Neg Hx    Colon polyps Neg Hx    Esophageal cancer Neg Hx    Rectal cancer Neg Hx    Stomach  cancer Neg Hx     Social History   Socioeconomic History   Marital status: Married    Spouse name: Not on file   Number of children: Not on file   Years of education: Not on file   Highest education level: Associate degree: occupational, Scientist, product/process development, or vocational program  Occupational History   Not on file  Tobacco Use   Smoking status: Never    Passive exposure: Past (as a child)   Smokeless tobacco: Never  Vaping Use   Vaping status: Never Used  Substance and Sexual Activity   Alcohol use: Yes    Alcohol/week: 4.0 standard drinks of alcohol    Types: 1 Glasses of wine, 1 Shots of liquor, 2 Standard drinks or equivalent per week   Drug use: Never   Sexual activity: Yes    Birth control/protection: Surgical  Other Topics Concern   Not on file  Social History Narrative   Not on file   Social Drivers of Health   Financial Resource Strain: Low Risk  (09/15/2023)   Overall Financial Resource Strain (CARDIA)    Difficulty of Paying Living Expenses: Not hard at all  Food Insecurity: No Food Insecurity (09/15/2023)   Hunger Vital Sign    Worried About Running Out of Food in the Last Year: Never true  Ran Out of Food in the Last Year: Never true  Transportation Needs: No Transportation Needs (09/15/2023)   PRAPARE - Administrator, Civil Service (Medical): No    Lack of Transportation (Non-Medical): No  Physical Activity: Insufficiently Active (09/15/2023)   Exercise Vital Sign    Days of Exercise per Week: 4 days    Minutes of Exercise per Session: 30 min  Stress: No Stress Concern Present (09/15/2023)   [employer] of Occupational Health - Occupational Stress Questionnaire    Feeling of Stress : Only a little  Social Connections: Socially Integrated (09/15/2023)   Social Connection and Isolation Panel [NHANES]    Frequency of Communication with Friends and Family: More than three times a week    Frequency of Social Gatherings with Friends and Family: Three  times a week    Attends Religious Services: More than 4 times per year    Active Member of Clubs or Organizations: Yes    Attends Banker Meetings: More than 4 times per year    Marital Status: Married  Catering manager Violence: Not At Risk (02/05/2023)   Humiliation, Afraid, Rape, and Kick questionnaire    Fear of Current or Ex-Partner: No    Emotionally Abused: No    Physically Abused: No    Sexually Abused: No    Review of Systems  All other systems reviewed and are negative.       Objective    BP 130/85   Pulse 79   Temp 98.1 F (36.7 C) (Oral)   Resp 16   Ht 6' (1.829 m)   Wt 274 lb 6.4 oz (124.5 kg)   SpO2 99%   BMI 37.22 kg/m   Physical Exam Vitals and nursing note reviewed.  Constitutional:      General: He is not in acute distress.    Appearance: He is obese.  HENT:     Head: Normocephalic and atraumatic.     Right Ear: Tympanic membrane, ear canal and external ear normal.     Left Ear: Tympanic membrane, ear canal and external ear normal.     Nose: Nose normal.     Mouth/Throat:     Mouth: Mucous membranes are moist.     Pharynx: Oropharynx is clear.  Eyes:     Conjunctiva/sclera: Conjunctivae normal.     Pupils: Pupils are equal, round, and reactive to light.  Neck:     Thyroid: No thyromegaly.  Cardiovascular:     Rate and Rhythm: Normal rate and regular rhythm.     Heart sounds: Normal heart sounds. No murmur heard. Pulmonary:     Effort: Pulmonary effort is normal.     Breath sounds: Normal breath sounds.  Abdominal:     General: There is no distension.     Palpations: Abdomen is soft. There is no mass.     Tenderness: There is no abdominal tenderness.     Hernia: There is no hernia in the left inguinal area or right inguinal area.  Genitourinary:    Penis: Normal.      Testes: Normal.  Musculoskeletal:        General: Normal range of motion.     Cervical back: Normal range of motion and neck supple.     Right lower leg:  No edema.     Left lower leg: No edema.  Skin:    General: Skin is warm and dry.  Neurological:     General: No focal deficit present.  Mental Status: He is alert and oriented to person, place, and time. Mental status is at baseline.  Psychiatric:        Mood and Affect: Mood normal.        Behavior: Behavior normal.         Assessment & Plan:   Annual physical exam -     CMP14+EGFR  Screening for deficiency anemia -     CBC with Differential/Platelet  Screening for lipid disorders -     Lipid panel     No follow-ups on file.   [name], MD

## 2023-10-15 ENCOUNTER — Other Ambulatory Visit: Payer: Self-pay | Admitting: Family Medicine

## 2023-10-15 DIAGNOSIS — I10 Essential (primary) hypertension: Secondary | ICD-10-CM

## 2023-10-29 ENCOUNTER — Other Ambulatory Visit: Payer: Self-pay | Admitting: Family Medicine

## 2023-10-29 DIAGNOSIS — E785 Hyperlipidemia, unspecified: Secondary | ICD-10-CM

## 2023-12-31 ENCOUNTER — Other Ambulatory Visit: Payer: Self-pay | Admitting: Family Medicine

## 2023-12-31 DIAGNOSIS — I10 Essential (primary) hypertension: Secondary | ICD-10-CM

## 2023-12-31 NOTE — Telephone Encounter (Signed)
 Copied from CRM [id number]. Topic: Clinical - Medication Refill >> Dec 31, 2023  8:03 AM Elle L wrote: Medication: hydrochlorothiazide  (HYDRODIURIL ) 25 MG tablet  The patient moved out of town but needs a refill to hold him over until his new patient appointment.  Has the patient contacted their pharmacy? Yes  This is the patient's preferred pharmacy:  [facility] - [place], [place] - [address] [phone] [address] [address]  Is this the correct pharmacy for this prescription? Yes  Has the prescription been filled recently? Yes  Is the patient out of the medication? No, few days left.   Has the patient been seen for an appointment in the last year OR does the patient have an upcoming appointment? Yes  Can we respond through MyChart? Yes  Agent: Please be advised that Rx refills may take up to 3 business days. We ask that you follow-up with your pharmacy.

## 2024-01-02 MED ORDER — HYDROCHLOROTHIAZIDE 25 MG PO TABS: 25.0000 mg | ORAL_TABLET | Freq: Every day | ORAL | 0 refills | Status: AC

## 2024-01-02 NOTE — Telephone Encounter (Signed)
 Requested Prescriptions  Pending Prescriptions Disp Refills   hydrochlorothiazide  (HYDRODIURIL ) 25 MG tablet 90 tablet 0    Sig: Take 1 tablet (25 mg total) by mouth daily.     Cardiovascular: Diuretics - Thiazide Passed - 01/02/2024  9:22 AM      Passed - Cr in normal range and within 180 days    Creatinine  Date Value Ref Range Status  12/07/2020 1.29 (H) 0.61 - 1.24 mg/dL Final   Creatinine, Ser  Date Value Ref Range Status  09/17/2023 1.10 0.76 - 1.27 mg/dL Final         Passed - K in normal range and within 180 days    Potassium  Date Value Ref Range Status  09/17/2023 4.9 3.5 - 5.2 mmol/L Final         Passed - Na in normal range and within 180 days    Sodium  Date Value Ref Range Status  09/17/2023 140 134 - 144 mmol/L Final         Passed - Last BP in normal range    BP Readings from Last 1 Encounters:  09/17/23 130/85         Passed - Valid encounter within last 6 months    Recent Outpatient Visits           3 months ago Annual physical exam   [facility], [place]   10 months ago Essential hypertension   [facility], MD   1 year ago Essential hypertension   [facility], MD   1 year ago Essential hypertension   [facility], MD   2 years ago Essential hypertension    [facility], [place]

## 2024-01-04 ENCOUNTER — Other Ambulatory Visit: Payer: Self-pay | Admitting: Family Medicine

## 2024-01-04 DIAGNOSIS — I10 Essential (primary) hypertension: Secondary | ICD-10-CM

## 2024-01-09 ENCOUNTER — Other Ambulatory Visit: Payer: Self-pay | Admitting: Family Medicine

## 2024-01-09 DIAGNOSIS — I10 Essential (primary) hypertension: Secondary | ICD-10-CM

## 2024-01-09 NOTE — Telephone Encounter (Signed)
 Copied from CRM [id number]. Topic: Clinical - Medication Refill >> Jan 09, 2024  2:14 PM Silvana PARAS wrote: Medication: losartan  (COZAAR ) 100 MG tablet, and hydrochlorothiazide  (HYDRODIURIL ) 25 MG tablet  Has the patient contacted their pharmacy? Yes (Agent: If no, request that the patient contact the pharmacy for the refill. If patient does not wish to contact the pharmacy document the reason why and proceed with request.) (Agent: If yes, when and what did the pharmacy advise?)  This is the patient's preferred pharmacy:    [facility] - [place], [place] - [address] [place] [address] Phone: [phone] Fax: [phone]  Is this the correct pharmacy for this prescription? Yes If no, delete pharmacy and type the correct one.   Has the prescription been filled recently? No  Is the patient out of the medication? Yes  Has the patient been seen for an appointment in the last year OR does the patient have an upcoming appointment? Yes  Can we respond through MyChart? Yes  Agent: Please be advised that Rx refills may take up to 3 business days. We ask that you follow-up with your pharmacy.

## 2024-01-12 MED ORDER — LOSARTAN POTASSIUM 100 MG PO TABS: 100.0000 mg | ORAL_TABLET | Freq: Every day | ORAL | 0 refills | Status: DC

## 2024-01-12 NOTE — Telephone Encounter (Signed)
 Rx: hydrochlorothiazide  01/02/24 #90 sent to new pharmacy Will send refill for losartan  100mg - patient has moved per notes Requested Prescriptions  Pending Prescriptions Disp Refills   losartan  (COZAAR ) 100 MG tablet 30 tablet 0    Sig: Take 1 tablet (100 mg total) by mouth daily.     Cardiovascular:  Angiotensin Receptor Blockers Passed - 01/12/2024  2:27 PM      Passed - Cr in normal range and within 180 days    Creatinine  Date Value Ref Range Status  12/07/2020 1.29 (H) 0.61 - 1.24 mg/dL Final   Creatinine, Ser  Date Value Ref Range Status  09/17/2023 1.10 0.76 - 1.27 mg/dL Final         Passed - K in normal range and within 180 days    Potassium  Date Value Ref Range Status  09/17/2023 4.9 3.5 - 5.2 mmol/L Final         Passed - Patient is not pregnant      Passed - Last BP in normal range    BP Readings from Last 1 Encounters:  09/17/23 130/85         Passed - Valid encounter within last 6 months    Recent Outpatient Visits           3 months ago Annual physical exam   [facility], [place]   10 months ago Essential hypertension   [facility], [place]   1 year ago Essential hypertension   [facility], [place]   1 year ago Essential hypertension   [facility], [place]   2 years ago Essential hypertension   [facility], [place]               hydrochlorothiazide  (HYDRODIURIL ) 25 MG tablet 90 tablet 0    Sig: Take 1 tablet (25 mg total) by mouth daily.     Cardiovascular: Diuretics - Thiazide Passed - 01/12/2024  2:27 PM      Passed - Cr in normal range and within 180 days    Creatinine  Date Value Ref Range Status  12/07/2020 1.29 (H) 0.61 - 1.24 mg/dL Final   Creatinine, Ser  Date Value Ref Range Status  09/17/2023 1.10 0.76 - 1.27 mg/dL Final          Passed - K in normal range and within 180 days    Potassium  Date Value Ref Range Status  09/17/2023 4.9 3.5 - 5.2 mmol/L Final         Passed - Na in normal range and within 180 days    Sodium  Date Value Ref Range Status  09/17/2023 140 134 - 144 mmol/L Final         Passed - Last BP in normal range    BP Readings from Last 1 Encounters:  09/17/23 130/85         Passed - Valid encounter within last 6 months    Recent Outpatient Visits           3 months ago Annual physical exam   [facility], [place]   10 months ago Essential hypertension   [facility], [place]   1 year ago Essential hypertension   [facility], [place]   1 year ago Essential hypertension   [facility] [facility], [place]   2 years ago Essential hypertension   [facility], [place]

## 2024-02-26 ENCOUNTER — Telehealth: Payer: Self-pay | Admitting: Family Medicine

## 2024-02-26 NOTE — Telephone Encounter (Signed)
 Spoke to patient to schedule AWV He stated he has moved and has new pcp

## 2024-04-05 ENCOUNTER — Other Ambulatory Visit: Payer: Self-pay | Admitting: Family Medicine

## 2024-07-04 ENCOUNTER — Other Ambulatory Visit: Payer: Self-pay | Admitting: Family Medicine

## 2024-07-05 NOTE — Telephone Encounter (Signed)
 Pt scheduled

## 2024-07-28 ENCOUNTER — Ambulatory Visit: Payer: Self-pay | Admitting: Family Medicine
